# Patient Record
Sex: Male | Born: 1950
Health system: Southern US, Community
[De-identification: ages and names within clinical notes are randomized; demographics above are authoritative.]

## PROBLEM LIST (undated history)

## (undated) DIAGNOSIS — B019 Varicella without complication: Secondary | ICD-10-CM

## (undated) HISTORY — DX: Varicella without complication: B01.9

## (undated) HISTORY — PX: BACK SURGERY: SHX140

---
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
## 2005-10-14 HISTORY — PX: APPENDECTOMY: SHX54

## 2009-01-29 ENCOUNTER — Emergency Department (HOSPITAL_COMMUNITY): Admission: AC | Admit: 2009-01-29 | Discharge: 2009-01-29 | Payer: Self-pay | Admitting: Emergency Medicine

## 2013-12-16 ENCOUNTER — Encounter: Payer: Self-pay | Admitting: Internal Medicine

## 2013-12-16 ENCOUNTER — Ambulatory Visit (INDEPENDENT_AMBULATORY_CARE_PROVIDER_SITE_OTHER)
Admission: RE | Admit: 2013-12-16 | Discharge: 2013-12-16 | Disposition: A | Discharge status: Home / Self Care | Payer: BC Managed Care – PPO | Source: Ambulatory Visit | Attending: Internal Medicine | Admitting: Internal Medicine

## 2013-12-16 ENCOUNTER — Ambulatory Visit (INDEPENDENT_AMBULATORY_CARE_PROVIDER_SITE_OTHER): Payer: BC Managed Care – PPO | Admitting: Internal Medicine

## 2013-12-16 VITALS — BP 122/86 | HR 81 | Temp 98.3°F | Ht 71.5 in | Wt 224.0 lb

## 2013-12-16 DIAGNOSIS — M25519 Pain in unspecified shoulder: Secondary | ICD-10-CM

## 2013-12-16 DIAGNOSIS — M25512 Pain in left shoulder: Secondary | ICD-10-CM

## 2013-12-16 DIAGNOSIS — Z0000 Encounter for general adult medical examination without abnormal findings: Secondary | ICD-10-CM

## 2013-12-16 LAB — COMPREHENSIVE METABOLIC PANEL
ALBUMIN: 4.4 g/dL (ref 3.5–5.2)
ALT: 23 U/L (ref 0–53)
AST: 19 U/L (ref 0–37)
Alkaline Phosphatase: 44 U/L (ref 39–117)
BUN: 20 mg/dL (ref 6–23)
CHLORIDE: 102 meq/L (ref 96–112)
CO2: 28 mEq/L (ref 19–32)
Calcium: 9.9 mg/dL (ref 8.4–10.5)
Creatinine, Ser: 1.3 mg/dL (ref 0.4–1.5)
GFR: 59.15 mL/min — ABNORMAL LOW (ref >60.00)
Glucose, Bld: 77 mg/dL (ref 70–99)
POTASSIUM: 3.9 meq/L (ref 3.5–5.1)
SODIUM: 139 meq/L (ref 135–145)
TOTAL PROTEIN: 7 g/dL (ref 6.0–8.3)
Total Bilirubin: 1 mg/dL (ref 0.3–1.2)

## 2013-12-16 LAB — LIPID PANEL
CHOL/HDL RATIO: 5
Cholesterol: 170 mg/dL (ref 0–200)
HDL: 34.7 mg/dL — ABNORMAL LOW (ref >39.00)
LDL Cholesterol: 105 mg/dL — ABNORMAL HIGH (ref 0–99)
TRIGLYCERIDES: 151 mg/dL — AB (ref 0.0–149.0)
VLDL: 30.2 mg/dL (ref 0.0–40.0)

## 2013-12-16 LAB — CBC
HCT: 44.7 % (ref 39.0–52.0)
Hemoglobin: 15.1 g/dL (ref 13.0–17.0)
MCHC: 33.7 g/dL (ref 30.0–36.0)
MCV: 88.4 fl (ref 78.0–100.0)
Platelets: 567 10*3/uL — ABNORMAL HIGH (ref 150.0–400.0)
RBC: 5.06 Mil/uL (ref 4.22–5.81)
RDW: 14.5 % (ref 11.5–14.6)
WBC: 7.7 10*3/uL (ref 4.5–10.5)

## 2013-12-16 MED ORDER — HYDROCODONE-ACETAMINOPHEN 10-325 MG PO TABS: 1.0000 | ORAL_TABLET | Freq: Two times a day (BID) | ORAL | Status: DC | PRN

## 2013-12-16 NOTE — Progress Notes (Signed)
Pre visit review using our clinic review tool, if applicable. No additional management support is needed unless otherwise documented below in the visit note. 

## 2013-12-16 NOTE — Patient Instructions (Addendum)

## 2013-12-16 NOTE — Assessment & Plan Note (Signed)
Will check xray today Norco refilled If xray negative will need to proceed with MRI to look at ligaments, tendons and rotator cuff Shoulder exercises given
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
## 2014-01-24 ENCOUNTER — Other Ambulatory Visit: Payer: Self-pay | Admitting: Internal Medicine

## 2014-01-24 ENCOUNTER — Telehealth: Payer: Self-pay | Admitting: Internal Medicine

## 2014-01-24 DIAGNOSIS — M25512 Pain in left shoulder: Secondary | ICD-10-CM

## 2014-01-24 NOTE — Telephone Encounter (Signed)
Pt does want to proceed with getting the MRI, in the mean time while waiting on the call to make appt he states he wanted to find out what or if insurance will cover

## 2014-01-24 NOTE — Telephone Encounter (Signed)
Please see msg below--please advise

## 2014-01-24 NOTE — Telephone Encounter (Signed)
Pt is calling back and says his shoulder has gotten way worse from when he was seen a couple of weeks ago. He says he was told to call back if it got worse. Please advise.

## 2014-01-24 NOTE — Telephone Encounter (Signed)
Does he want to proceed with MRI?

## 2014-02-02 ENCOUNTER — Ambulatory Visit
Admission: RE | Admit: 2014-02-02 | Discharge: 2014-02-02 | Disposition: A | Discharge status: Home / Self Care | Payer: BC Managed Care – PPO | Source: Ambulatory Visit | Attending: Internal Medicine | Admitting: Internal Medicine

## 2014-02-02 DIAGNOSIS — M25512 Pain in left shoulder: Secondary | ICD-10-CM

## 2014-02-03 ENCOUNTER — Other Ambulatory Visit: Payer: Self-pay | Admitting: Internal Medicine

## 2014-02-03 DIAGNOSIS — M25519 Pain in unspecified shoulder: Secondary | ICD-10-CM

## 2014-02-07 ENCOUNTER — Other Ambulatory Visit: Payer: Self-pay

## 2014-02-07 DIAGNOSIS — M25512 Pain in left shoulder: Secondary | ICD-10-CM

## 2014-02-07 MED ORDER — HYDROCODONE-ACETAMINOPHEN 10-325 MG PO TABS: 1.0000 | ORAL_TABLET | Freq: Two times a day (BID) | ORAL | Status: DC | PRN

## 2014-02-07 NOTE — Telephone Encounter (Signed)
Rx left in front office for pick up and pt is aware  

## 2014-02-07 NOTE — Telephone Encounter (Signed)
Pt left v/m requesting rx hydrocodone apap. Call when ready for pick up. Pt is out of med. 

## 2014-03-28 ENCOUNTER — Other Ambulatory Visit: Payer: Self-pay

## 2014-03-28 DIAGNOSIS — M25512 Pain in left shoulder: Secondary | ICD-10-CM

## 2014-03-28 MED ORDER — HYDROCODONE-ACETAMINOPHEN 10-325 MG PO TABS: 1.0000 | ORAL_TABLET | Freq: Two times a day (BID) | ORAL | Status: DC | PRN

## 2014-03-28 NOTE — Telephone Encounter (Signed)
Spoke to pt and informed him Rx is available for pickup  

## 2014-03-28 NOTE — Telephone Encounter (Signed)
Pt left v/m requesting rx hydrocodone apap. Call when ready for pick up.  

## 2014-03-29 ENCOUNTER — Encounter: Payer: Self-pay | Admitting: Internal Medicine

## 2014-05-24 ENCOUNTER — Other Ambulatory Visit: Payer: Self-pay

## 2014-05-24 DIAGNOSIS — M25512 Pain in left shoulder: Secondary | ICD-10-CM

## 2014-05-24 NOTE — Telephone Encounter (Signed)
Pt left v/m requesting rx hydrocodone apap. Call when ready for pick up.  

## 2014-05-25 ENCOUNTER — Encounter: Payer: Self-pay | Admitting: Internal Medicine

## 2014-05-25 MED ORDER — HYDROCODONE-ACETAMINOPHEN 10-325 MG PO TABS: 1.0000 | ORAL_TABLET | Freq: Two times a day (BID) | ORAL | Status: DC | PRN

## 2014-05-25 NOTE — Telephone Encounter (Signed)
Rx left in front office for pick up and pt is aware  

## 2014-06-14 ENCOUNTER — Encounter: Payer: Self-pay | Admitting: Internal Medicine

## 2014-07-15 ENCOUNTER — Other Ambulatory Visit: Payer: Self-pay

## 2014-07-15 DIAGNOSIS — M25512 Pain in left shoulder: Secondary | ICD-10-CM

## 2014-07-15 MED ORDER — HYDROCODONE-ACETAMINOPHEN 10-325 MG PO TABS: 1.0000 | ORAL_TABLET | Freq: Two times a day (BID) | ORAL | Status: DC | PRN

## 2014-07-15 NOTE — Telephone Encounter (Signed)
Pt left v/m requesting rx hydrocodone apap. Call when ready for pick up.  

## 2014-07-18 NOTE — Telephone Encounter (Signed)
Rx left in front office for pick up and pt is aware  

## 2014-09-12 ENCOUNTER — Other Ambulatory Visit: Payer: Self-pay

## 2014-09-12 DIAGNOSIS — M25512 Pain in left shoulder: Secondary | ICD-10-CM

## 2014-09-12 NOTE — Telephone Encounter (Signed)
Pt left v/m requesting rx hydrocodone apap. Call when ready for pick up.  

## 2014-09-13 MED ORDER — HYDROCODONE-ACETAMINOPHEN 10-325 MG PO TABS: 1.0000 | ORAL_TABLET | Freq: Two times a day (BID) | ORAL | Status: DC | PRN

## 2014-09-13 NOTE — Telephone Encounter (Signed)
UDS reviewed- not due for UDS, [facility]

## 2014-09-13 NOTE — Telephone Encounter (Signed)
Rx left in front office for pick up and pt is aware  

## 2014-10-25 ENCOUNTER — Other Ambulatory Visit: Payer: Self-pay

## 2014-10-25 DIAGNOSIS — M25512 Pain in left shoulder: Secondary | ICD-10-CM

## 2014-10-25 MED ORDER — HYDROCODONE-ACETAMINOPHEN 10-325 MG PO TABS: 1.0000 | ORAL_TABLET | Freq: Two times a day (BID) | ORAL | Status: DC | PRN

## 2014-10-25 NOTE — Telephone Encounter (Signed)
Pt left v/m requesting rx hydrocodone apap. Call when ready for pick up.last seen 12/16/13 no future appt scheduled.

## 2014-10-25 NOTE — Telephone Encounter (Signed)
RX printed and signed, placed in Mel's box

## 2014-10-26 NOTE — Telephone Encounter (Signed)
Rx left in front office for pick up and pt is aware  

## 2014-11-17 ENCOUNTER — Ambulatory Visit (INDEPENDENT_AMBULATORY_CARE_PROVIDER_SITE_OTHER): Payer: BLUE CROSS/BLUE SHIELD | Admitting: Internal Medicine

## 2014-11-17 ENCOUNTER — Encounter: Payer: Self-pay | Admitting: Internal Medicine

## 2014-11-17 VITALS — BP 118/80 | HR 61 | Temp 98.5°F | Wt 231.0 lb

## 2014-11-17 DIAGNOSIS — M259 Joint disorder, unspecified: Secondary | ICD-10-CM

## 2014-11-17 DIAGNOSIS — L989 Disorder of the skin and subcutaneous tissue, unspecified: Secondary | ICD-10-CM

## 2014-11-17 DIAGNOSIS — M19019 Primary osteoarthritis, unspecified shoulder: Secondary | ICD-10-CM

## 2014-11-17 NOTE — Patient Instructions (Signed)

## 2014-11-17 NOTE — Progress Notes (Signed)
Subjective:    Patient ID: Thomas Lamb, male    DOB: 07-31-1951, 64 y.o.   MRN: [id number]  HPI  Pt presents to the clinic today with c/o left shoulder pain. This has been a chronic issue for him. Xray of left shoulder and MRI reviewed in EMR, showed OA of left AC joint. It seems to be worse anytime he reaches for anything, above his head or behind his back. He denies numbness, tingling or weakness in the left arm. He takes glucosamine and norco for his pain. He has not had any injury to the area. He would like referral to an arthritis specialist he found at [facility].  Additionally, he is concerned about 2 moles that are on his back. They have been there a long time but he reports they are changing. They seem to be scaly and itchy. The areas have not drained. He has not tried anything OTC. He has no history of skin cancer. He would like them evaluated today.  Review of Systems      Past Medical History  Diagnosis Date  . Chicken pox     Current Outpatient Prescriptions  Medication Sig Dispense Refill  . Glucosa-Chondr-Na Chondr-MSM [id number] MG TABS Take 1 tablet by mouth daily.    Marland Kitchen HYDROcodone-acetaminophen (NORCO) 10-325 MG per tablet Take 1 tablet by mouth 2 (two) times daily as needed. 30 tablet 0  . Multiple Vitamin (MULTIVITAMIN) tablet Take 1 tablet by mouth daily.     No current facility-administered medications for this visit.    Allergies  Allergen Reactions  . Penicillins Hives    Family History  Problem Relation Age of Onset  . Arthritis Mother   . Arthritis Father   . Stroke Father   . Hypertension Father   . Cancer Neg Hx   . Diabetes Neg Hx   . Early death Neg Hx     History   Social History  . Marital Status: Married    Spouse Name: N/A    Number of Children: N/A  . Years of Education: N/A   Occupational History  . Not on file.   Social History Main Topics  . Smoking status: Former Smoker    Types: Cigarettes    . Smokeless tobacco: Never Used  . Alcohol Use: 1.2 oz/week    2 Cans of beer per week     Comment: occasional  . Drug Use: No  . Sexual Activity: Not on file   Other Topics Concern  . Not on file   Social History Narrative     Constitutional: Denies fever, malaise, fatigue, headache or abrupt weight changes.  Respiratory: Denies difficulty breathing, shortness of breath, cough or sputum production.   Cardiovascular: Denies chest pain, chest tightness, palpitations or swelling in the hands or feet.  Musculoskeletal: Pt reports left shoulder pain. Denies difficulty with gait, muscle pain or joint swelling.  Skin: Pt reports abnormal mole on back. Denies redness, rashes, or ulcercations.  Neurological: Denies dizziness, difficulty with memory, difficulty with speech or problems with balance and coordination.   No other specific complaints in a complete review of systems (except as listed in HPI above).  Objective:   Physical Exam   BP 118/80 mmHg  Pulse 61  Temp(Src) 98.5 F (36.9 C) (Oral)  Wt 231 lb (104.781 kg)  SpO2 98% Wt Readings from Last 3 Encounters:  11/17/14 231 lb (104.781 kg)  12/16/13 224 lb (101.606 kg)    General: Appears his stated  age, well developed, well nourished in NAD. Skin: Warm, dry and intact. 0.5 cm oval dark, scaly lesion noted on right upper back. Borders regular, uniform in color but very dark. 0.25 cm oval lesion noted an right shoulder blade. Skin colored with dark center, scaly but with regular borders. Cardiovascular: Normal rate and rhythm. S1,S2 noted.  No murmur, rubs or gallops noted.  Pulmonary/Chest: Normal effort and positive vesicular breath sounds. No respiratory distress. No wheezes, rales or ronchi noted.  Musculoskeletal: Decreased internal and external rotation of the left shoulder. Pain with palpation of the left AC joint. Strength 5/5 BUE. Neurological: Alert and oriented. Coordination normal.   BMET    Component Value  Date/Time   NA 139 12/16/2013 1018   K 3.9 12/16/2013 1018   CL 102 12/16/2013 1018   CO2 28 12/16/2013 1018   GLUCOSE 77 12/16/2013 1018   BUN 20 12/16/2013 1018   CREATININE 1.3 12/16/2013 1018   CALCIUM 9.9 12/16/2013 1018    Lipid Panel     Component Value Date/Time   CHOL 170 12/16/2013 1018   TRIG 151.0* 12/16/2013 1018   HDL 34.70* 12/16/2013 1018   CHOLHDL 5 12/16/2013 1018   VLDL 30.2 12/16/2013 1018   LDLCALC 105* 12/16/2013 1018    CBC    Component Value Date/Time   WBC 7.7 12/16/2013 1018   RBC 5.06 12/16/2013 1018   HGB 15.1 12/16/2013 1018   HCT 44.7 12/16/2013 1018   PLT 567.0* 12/16/2013 1018   MCV 88.4 12/16/2013 1018   MCHC 33.7 12/16/2013 1018   RDW 14.5 12/16/2013 1018    Hgb A1C No results found for: HGBA1C      Assessment & Plan:   Skin lesion of back:  Likely an actinic keratosis Will refer to derm for further evaluation and biopsy  AC arthropathy of left shoulder:  Xray and MRI reviewed Continue glucosamine and norco Will refer to [facility]- Dr. Dareen PianoAnderson or Dr. Nickola MajorHawkes for further evaluation  RTC as needed or if symptoms persist or worsen

## 2014-11-17 NOTE — Progress Notes (Signed)
Pre visit review using our clinic review tool, if applicable. No additional management support is needed unless otherwise documented below in the visit note. 

## 2014-11-21 ENCOUNTER — Ambulatory Visit (INDEPENDENT_AMBULATORY_CARE_PROVIDER_SITE_OTHER): Payer: BLUE CROSS/BLUE SHIELD | Admitting: Family Medicine

## 2014-11-21 ENCOUNTER — Encounter: Payer: Self-pay | Admitting: Family Medicine

## 2014-11-21 VITALS — BP 120/74 | HR 60 | Temp 98.2°F | Ht 71.5 in | Wt 232.2 lb

## 2014-11-21 DIAGNOSIS — M19019 Primary osteoarthritis, unspecified shoulder: Secondary | ICD-10-CM

## 2014-11-21 DIAGNOSIS — M67912 Unspecified disorder of synovium and tendon, left shoulder: Secondary | ICD-10-CM

## 2014-11-21 DIAGNOSIS — M259 Joint disorder, unspecified: Secondary | ICD-10-CM

## 2014-11-21 DIAGNOSIS — M7502 Adhesive capsulitis of left shoulder: Secondary | ICD-10-CM

## 2014-11-21 DIAGNOSIS — M25512 Pain in left shoulder: Secondary | ICD-10-CM

## 2014-11-21 MED ORDER — METHYLPREDNISOLONE ACETATE 40 MG/ML IJ SUSP
80.0000 mg | Freq: Once | INTRAMUSCULAR | Status: AC
  Administered 2014-11-21: 80 mg via INTRA_ARTICULAR

## 2014-11-21 NOTE — Progress Notes (Signed)
Pre visit review using our clinic review tool, if applicable. No additional management support is needed unless otherwise documented below in the visit note. 
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
## 2014-11-25 DIAGNOSIS — M25519 Pain in unspecified shoulder: Secondary | ICD-10-CM

## 2014-11-25 DIAGNOSIS — G8929 Other chronic pain: Secondary | ICD-10-CM | POA: Insufficient documentation

## 2014-12-29 ENCOUNTER — Other Ambulatory Visit: Payer: Self-pay

## 2014-12-29 DIAGNOSIS — M25512 Pain in left shoulder: Secondary | ICD-10-CM

## 2014-12-29 MED ORDER — HYDROCODONE-ACETAMINOPHEN 10-325 MG PO TABS: 1.0000 | ORAL_TABLET | Freq: Two times a day (BID) | ORAL | Status: DC | PRN

## 2014-12-29 NOTE — Telephone Encounter (Signed)
RX printed and signed in MYD box

## 2014-12-29 NOTE — Telephone Encounter (Signed)
Pt left v/m requesting rx hydrocodone apap. Call when ready for pick up.  

## 2014-12-30 NOTE — Telephone Encounter (Signed)
Rx left in front office for pick up and pt is aware  

## 2015-01-18 ENCOUNTER — Ambulatory Visit: Payer: BLUE CROSS/BLUE SHIELD | Admitting: Family Medicine

## 2015-02-16 ENCOUNTER — Other Ambulatory Visit: Payer: Self-pay

## 2015-02-16 DIAGNOSIS — M25512 Pain in left shoulder: Secondary | ICD-10-CM

## 2015-02-16 MED ORDER — HYDROCODONE-ACETAMINOPHEN 10-325 MG PO TABS: 1.0000 | ORAL_TABLET | Freq: Two times a day (BID) | ORAL | Status: DC | PRN

## 2015-02-16 NOTE — Telephone Encounter (Signed)
Rx left in front office for pick up and pt is aware  

## 2015-02-16 NOTE — Telephone Encounter (Signed)
RX printed and signed and placed in MYD box 

## 2015-02-16 NOTE — Telephone Encounter (Signed)
Pt left v/m requesting rx hydrocodone apap. Call when ready for pick up. Pt last seen 11/21/2014 and rx last printed 12/29/14 for # 30.

## 2015-04-11 ENCOUNTER — Other Ambulatory Visit: Payer: Self-pay

## 2015-04-11 DIAGNOSIS — M25512 Pain in left shoulder: Secondary | ICD-10-CM

## 2015-04-11 NOTE — Telephone Encounter (Signed)
Pt left v/m requesting rx hydrocodone apap. Call when ready for pick up ASAP.rx last printed # 30 on 02/16/15; last seen 11/21/2014 with shoulder pain.

## 2015-04-12 MED ORDER — HYDROCODONE-ACETAMINOPHEN 10-325 MG PO TABS: 1.0000 | ORAL_TABLET | Freq: Two times a day (BID) | ORAL | Status: DC | PRN

## 2015-04-12 NOTE — Addendum Note (Signed)
Addended by: Roena MaladyEVONTENNO, Jaleeyah Munce Y on: 04/12/2015 11:36 AM   Modules accepted: Orders

## 2015-04-12 NOTE — Telephone Encounter (Signed)
RX printed and signed placed in MYD box

## 2015-04-12 NOTE — Telephone Encounter (Signed)
Rx left in front office for pick up and pt is aware UDS needed with next refill

## 2015-06-15 ENCOUNTER — Encounter: Payer: Self-pay | Admitting: Internal Medicine

## 2015-06-15 ENCOUNTER — Other Ambulatory Visit: Payer: Self-pay

## 2015-06-15 DIAGNOSIS — M25512 Pain in left shoulder: Secondary | ICD-10-CM

## 2015-06-15 MED ORDER — HYDROCODONE-ACETAMINOPHEN 10-325 MG PO TABS: 1.0000 | ORAL_TABLET | Freq: Two times a day (BID) | ORAL | Status: DC | PRN

## 2015-06-15 NOTE — Telephone Encounter (Signed)
Pt left v/m requesting rx hydrocodone apap. Call when ready for pick up. rx last printed # 30 on 04/12/15. Pt last seen 11/21/2014.

## 2015-06-15 NOTE — Telephone Encounter (Signed)
Rx left in front office for pick up and pt is aware Pt is due for repeat UDS

## 2015-06-15 NOTE — Telephone Encounter (Signed)
RX printed and signed and placed in MYD box 

## 2015-07-17 ENCOUNTER — Encounter: Payer: Self-pay | Admitting: Internal Medicine

## 2015-08-09 ENCOUNTER — Encounter: Payer: Self-pay | Admitting: Internal Medicine

## 2015-08-09 ENCOUNTER — Other Ambulatory Visit: Payer: Self-pay | Admitting: *Deleted

## 2015-08-09 DIAGNOSIS — M25512 Pain in left shoulder: Secondary | ICD-10-CM

## 2015-08-09 MED ORDER — HYDROCODONE-ACETAMINOPHEN 10-325 MG PO TABS: 1.0000 | ORAL_TABLET | Freq: Two times a day (BID) | ORAL | Status: DC | PRN

## 2015-08-09 NOTE — Telephone Encounter (Signed)
Error no UDS needed

## 2015-08-09 NOTE — Telephone Encounter (Signed)
RX printed and signed and placed in MYD box 

## 2015-08-09 NOTE — Telephone Encounter (Signed)
Rx left in front office for pick up and pt is aware Needs UDS

## 2015-10-02 ENCOUNTER — Other Ambulatory Visit: Payer: Self-pay

## 2015-10-02 DIAGNOSIS — M25512 Pain in left shoulder: Secondary | ICD-10-CM

## 2015-10-02 MED ORDER — HYDROCODONE-ACETAMINOPHEN 10-325 MG PO TABS: 1.0000 | ORAL_TABLET | Freq: Two times a day (BID) | ORAL | Status: DC | PRN

## 2015-10-02 NOTE — Telephone Encounter (Signed)
Rx left in front office for pick up and pt is aware  

## 2015-10-02 NOTE — Telephone Encounter (Signed)
Pt left v/m requesting rx hydrocodone apap. Call when ready for pick up. rx last printed # 30 on 08/09/15. Last seen 11/21/14.

## 2015-10-02 NOTE — Telephone Encounter (Signed)
RX printed and signed and placed in MYD box 

## 2015-11-29 ENCOUNTER — Other Ambulatory Visit: Payer: Self-pay | Admitting: *Deleted

## 2015-11-29 DIAGNOSIS — M25512 Pain in left shoulder: Secondary | ICD-10-CM

## 2015-11-29 MED ORDER — HYDROCODONE-ACETAMINOPHEN 10-325 MG PO TABS: 1.0000 | ORAL_TABLET | Freq: Two times a day (BID) | ORAL | Status: DC | PRN

## 2015-11-29 NOTE — Telephone Encounter (Signed)
Last filled #30 10/02/15.  Patient has not been seen in our office since February 2016.  Okay to refill?

## 2015-11-29 NOTE — Telephone Encounter (Signed)
Rx left in front office for pick up and pt is aware Pt expressed understanding to schedule annual physical

## 2015-11-29 NOTE — Telephone Encounter (Signed)
Will refill x 30 days, he needs to make follow up appt

## 2016-02-08 ENCOUNTER — Other Ambulatory Visit: Payer: Self-pay

## 2016-02-08 DIAGNOSIS — M25512 Pain in left shoulder: Secondary | ICD-10-CM

## 2016-02-08 MED ORDER — HYDROCODONE-ACETAMINOPHEN 10-325 MG PO TABS: 1.0000 | ORAL_TABLET | Freq: Two times a day (BID) | ORAL | Status: DC | PRN

## 2016-02-08 NOTE — Telephone Encounter (Signed)
RX printed and signed and placed in MYD box. He needs to make an appt for follow up or annual exam

## 2016-02-08 NOTE — Telephone Encounter (Signed)
Pt left v/m requesting rx hydrocodone apap. Call when ready for pick up. rx last printed # 30 on 11/29/15. Pt last seen 11/21/2014. No future appt scheduled.

## 2016-02-08 NOTE — Telephone Encounter (Signed)
Rx left in front office for pick up and pt is aware  

## 2016-04-11 ENCOUNTER — Other Ambulatory Visit: Payer: Self-pay

## 2016-04-11 DIAGNOSIS — M25512 Pain in left shoulder: Secondary | ICD-10-CM

## 2016-04-11 NOTE — Telephone Encounter (Signed)
He must schedule an appt or no refill will be given

## 2016-04-11 NOTE — Telephone Encounter (Signed)
Per 11/2015 and 01/2016 refill note--pt was advised both times that he needed to schedule an annual exam and expressed understanding

## 2016-04-11 NOTE — Telephone Encounter (Signed)
Pt left v/m requesting rx hydrocodone apap. Call when ready for pick up. Last printed # 30 on 02/08/16. Last seen 11/21/2014. No future appt scheduled.

## 2016-04-12 NOTE — Telephone Encounter (Signed)
Pt has CPE appt scheduled for 07/2016---pt is aware he needs to keep appt---pt wants to pick up Rx Monday afternoon

## 2016-04-12 NOTE — Addendum Note (Signed)
Addended by: Roena MaladyEVONTENNO, Patrese Neal Y on: 04/12/2016 04:18 PM   Modules accepted: Orders

## 2016-04-13 NOTE — Telephone Encounter (Signed)
Ok, will have Dr. Dayton MartesAron fill in my abscence

## 2016-04-15 MED ORDER — HYDROCODONE-ACETAMINOPHEN 10-325 MG PO TABS: 1.0000 | ORAL_TABLET | Freq: Two times a day (BID) | ORAL | Status: DC | PRN

## 2016-04-15 NOTE — Telephone Encounter (Signed)
Lm on pts vm and advised Rx is available for pickup from the front desk 

## 2016-05-29 ENCOUNTER — Other Ambulatory Visit: Payer: Self-pay

## 2016-05-29 DIAGNOSIS — M25512 Pain in left shoulder: Secondary | ICD-10-CM

## 2016-05-29 MED ORDER — HYDROCODONE-ACETAMINOPHEN 10-325 MG PO TABS: 1.0000 | ORAL_TABLET | Freq: Two times a day (BID) | ORAL | 0 refills | Status: DC | PRN

## 2016-05-29 NOTE — Telephone Encounter (Signed)
Rx left in front office for pick up and pt is aware  

## 2016-05-29 NOTE — Telephone Encounter (Signed)
Pt left v/m requesting rx hydrocodone apap. Call when ready for pick up. rx last printed # 30 on 04/15/16; last acute visit 11/21/14; pt has CPX scheduled on 07/15/16.Please advise.

## 2016-05-29 NOTE — Telephone Encounter (Signed)
RX printed and signed and placed in MYD box 

## 2016-07-15 ENCOUNTER — Ambulatory Visit (INDEPENDENT_AMBULATORY_CARE_PROVIDER_SITE_OTHER): Payer: Medicare HMO | Admitting: Internal Medicine

## 2016-07-15 ENCOUNTER — Encounter: Payer: Self-pay | Admitting: Internal Medicine

## 2016-07-15 VITALS — BP 124/80 | HR 72 | Temp 98.9°F | Ht 71.75 in | Wt 232.2 lb

## 2016-07-15 DIAGNOSIS — Z0000 Encounter for general adult medical examination without abnormal findings: Secondary | ICD-10-CM | POA: Diagnosis not present

## 2016-07-15 DIAGNOSIS — Z114 Encounter for screening for human immunodeficiency virus [HIV]: Secondary | ICD-10-CM | POA: Diagnosis not present

## 2016-07-15 DIAGNOSIS — Z1159 Encounter for screening for other viral diseases: Secondary | ICD-10-CM

## 2016-07-15 DIAGNOSIS — M7502 Adhesive capsulitis of left shoulder: Secondary | ICD-10-CM

## 2016-07-15 DIAGNOSIS — R69 Illness, unspecified: Secondary | ICD-10-CM | POA: Diagnosis not present

## 2016-07-15 LAB — COMPREHENSIVE METABOLIC PANEL
ALBUMIN: 4 g/dL (ref 3.5–5.2)
ALK PHOS: 48 U/L (ref 39–117)
ALT: 25 U/L (ref 0–53)
AST: 22 U/L (ref 0–37)
BUN: 19 mg/dL (ref 6–23)
CO2: 30 mEq/L (ref 19–32)
Calcium: 9.3 mg/dL (ref 8.4–10.5)
Chloride: 108 mEq/L (ref 96–112)
Creatinine, Ser: 1.08 mg/dL (ref 0.40–1.50)
GFR: 72.67 mL/min (ref >60.00)
Glucose, Bld: 69 mg/dL — ABNORMAL LOW (ref 70–99)
POTASSIUM: 4.5 meq/L (ref 3.5–5.1)
Sodium: 143 mEq/L (ref 135–145)
Total Bilirubin: 0.4 mg/dL (ref 0.2–1.2)
Total Protein: 6.6 g/dL (ref 6.0–8.3)

## 2016-07-15 LAB — CBC
HEMATOCRIT: 42.2 % (ref 39.0–52.0)
HEMOGLOBIN: 14.4 g/dL (ref 13.0–17.0)
MCHC: 34.1 g/dL (ref 30.0–36.0)
MCV: 86.5 fl (ref 78.0–100.0)
Platelets: 506 10*3/uL — ABNORMAL HIGH (ref 150.0–400.0)
RBC: 4.88 Mil/uL (ref 4.22–5.81)
RDW: 14.4 % (ref 11.5–15.5)
WBC: 6.6 10*3/uL (ref 4.0–10.5)

## 2016-07-15 LAB — LIPID PANEL
CHOLESTEROL: 152 mg/dL (ref 0–200)
HDL: 36.5 mg/dL — AB (ref >39.00)
LDL Cholesterol: 83 mg/dL (ref 0–99)
NonHDL: 115.73
Total CHOL/HDL Ratio: 4
Triglycerides: 163 mg/dL — ABNORMAL HIGH (ref 0.0–149.0)
VLDL: 32.6 mg/dL (ref 0.0–40.0)

## 2016-07-15 MED ORDER — HYDROCODONE-ACETAMINOPHEN 10-325 MG PO TABS: 1.0000 | ORAL_TABLET | Freq: Two times a day (BID) | ORAL | 0 refills | Status: DC | PRN

## 2016-07-15 NOTE — Assessment & Plan Note (Signed)
Improved Continue Glucosamin-Chondroitin Norco refilled today Sent to lab to update CSA/UDS

## 2016-07-15 NOTE — Patient Instructions (Signed)

## 2016-07-15 NOTE — Progress Notes (Signed)
Subjective:    Patient ID: Thomas Lamb, male    DOB: 11/21/1950, 65 y.o.   MRN: [id number]  HPI  Pt presents to the clinic today for his Medicare Wellness Exam. He is also due to follow up left shoulder pain.  Adhesive capsulitis of left shoulder: Improved. Secondary to rotator cuff tendinopathy and AC joint arthritis. S/p steroid injections. He takes Glucosamine-Chondroitin daily and Norco as needed with good relief. He would like a refill of the Norco today. He is due to update is CSA/UDS today. CSRS reviewed- no other prescribers.   Past Medical History:  Diagnosis Date  . Chicken pox     Current Outpatient Prescriptions  Medication Sig Dispense Refill  . Glucosa-Chondr-Na Chondr-MSM [phone] MG TABS Take 1 tablet by mouth daily.    Marland Kitchen HYDROcodone-acetaminophen (NORCO) 10-325 MG tablet Take 1 tablet by mouth 2 (two) times daily as needed. 30 tablet 0  . Multiple Vitamin (MULTIVITAMIN) tablet Take 1 tablet by mouth daily.     No current facility-administered medications for this visit.     Allergies  Allergen Reactions  . Penicillins Hives    Family History  Problem Relation Age of Onset  . Arthritis Mother   . Arthritis Father   . Stroke Father   . Hypertension Father   . Cancer Neg Hx   . Diabetes Neg Hx   . Early death Neg Hx     Social History   Social History  . Marital status: Married    Spouse name: N/A  . Number of children: N/A  . Years of education: N/A   Occupational History  . Not on file.   Social History Main Topics  . Smoking status: Former Smoker    Types: Cigarettes  . Smokeless tobacco: Never Used  . Alcohol use 1.2 oz/week    2 Cans of beer per week     Comment: occasional  . Drug use: No  . Sexual activity: Not on file   Other Topics Concern  . Not on file   Social History Narrative  . No narrative on file    Hospitiliaztions: None  Health Maintenance:    Flu: never  Tetanus: within the last 10  years  Pneumovax: never  Prevnar: never  Zostovax: never  PSA screening: never  Colon Screening: never  Vision Screening: yearly  Dentist: yearly  Providers:   PCP: Nicki Reaper, NP-C    I have personally reviewed and have noted:  1. The patient's medical and social history 2. Their use of alcohol, tobacco or illicit drugs 3. Their current medications and supplements 4. The patient's functional ability including ADL's, fall risks, home safety risks and hearing or visual impairment. 5. Diet and physical activities 6. Evidence for depression or mood disorder  Subjective:   Review of Systems:   Constitutional: Denies fever, malaise, fatigue, headache or abrupt weight changes.  HEENT: Pt reports ringing in his ears. Denies eye pain, eye redness, ear pain, wax buildup, runny nose, nasal congestion, bloody nose, or sore throat. Respiratory: Denies difficulty breathing, shortness of breath, cough or sputum production.   Cardiovascular: Denies chest pain, chest tightness, palpitations or swelling in the hands or feet.  Gastrointestinal: Pt reports occasional reflux. Denies abdominal pain, bloating, constipation, diarrhea or blood in the stool.  GU: Denies urgency, frequency, pain with urination, burning sensation, blood in urine, odor or discharge. Musculoskeletal: Pt reports intermittent left shoulder pain. Denies decrease in range of motion, difficulty with gait, muscle pain or  joint swelling.  Skin: Denies redness, rashes, lesions or ulcercations.  Neurological: Denies dizziness, difficulty with memory, difficulty with speech or problems with balance and coordination.  Psych: Denies anxiety, depression, SI/HI.  No other specific complaints in a complete review of systems (except as listed in HPI above).  Objective:  PE:   BP 124/80   Pulse 72   Temp 98.9 F (37.2 C) (Oral)   Ht 5' 11.75" (1.822 m)   Wt 232 lb 4 oz (105.3 kg)   SpO2 98%   BMI 31.72 kg/m  Wt Readings  from Last 3 Encounters:  07/15/16 232 lb 4 oz (105.3 kg)  11/21/14 232 lb 4 oz (105.3 kg)  11/17/14 231 lb (104.8 kg)    General: Appears his stated age, obese in NAD. Skin: Warm, dry and intact.  HEENT: Head: normal shape and size; Eyes: sclera white, no icterus, conjunctiva pink, PERRLA and EOMs intact; Ears: Tm's gray and intact, normal light reflex; Throat/Mouth: Teeth present, mucosa pink and moist, no exudate, lesions or ulcerations noted.  Neck: Neck supple, trachea midline. No masses, lumps or thyromegaly present.  Cardiovascular: Normal rate and rhythm. S1,S2 noted.  No murmur, rubs or gallops noted. No JVD or BLE edema. No carotid bruits noted. Pulmonary/Chest: Normal effort and positive vesicular breath sounds. No respiratory distress. No wheezes, rales or ronchi noted.  Abdomen: Soft and nontender. Normal bowel sounds. No distention or masses noted. Liver, spleen and kidneys non palpable. Musculoskeletal: Normal range of motion. Strength 5/5 BUE/BLE. No signs of joint swelling.  Neurological: Alert and oriented. Cranial nerves II-XII grossly intact. Coordination normal.  Psychiatric: Mood and affect normal. Behavior is normal. Judgment and thought content normal.     BMET    Component Value Date/Time   NA 139 12/16/2013 1018   K 3.9 12/16/2013 1018   CL 102 12/16/2013 1018   CO2 28 12/16/2013 1018   GLUCOSE 77 12/16/2013 1018   BUN 20 12/16/2013 1018   CREATININE 1.3 12/16/2013 1018   CALCIUM 9.9 12/16/2013 1018    Lipid Panel     Component Value Date/Time   CHOL 170 12/16/2013 1018   TRIG 151.0 (H) 12/16/2013 1018   HDL 34.70 (L) 12/16/2013 1018   CHOLHDL 5 12/16/2013 1018   VLDL 30.2 12/16/2013 1018   LDLCALC 105 (H) 12/16/2013 1018    CBC    Component Value Date/Time   WBC 7.7 12/16/2013 1018   RBC 5.06 12/16/2013 1018   HGB 15.1 12/16/2013 1018   HCT 44.7 12/16/2013 1018   PLT 567.0 (H) 12/16/2013 1018   MCV 88.4 12/16/2013 1018   MCHC 33.7  12/16/2013 1018   RDW 14.5 12/16/2013 1018    Hgb A1C No results found for: HGBA1C    Assessment and Plan:   Medicare Annual Wellness Visit:  Diet: He does eat meat. He consume fruits and veggies daily. He does eat fried foods. He drinks mostly Coke Zero. Physical activity: He plays basketball once every other week and walks daily. Depression/mood screen: Negative Hearing: He reports he has difficulty hearing in large crowds. Intact to whispered voice Visual acuity: Grossly normal, performs annual eye exam  ADLs: Capable Fall risk: None Home safety: Good Cognitive evaluation: Intact to orientation, naming, recall and repetition EOL planning: Adv directives, full code/ I agree  Preventative Medicine: He declines flu, tetanus, pneumovax, prevnar and zostovax. He declines prostate or colon cancer screening. Encouraged him to consume a balanced diet and exercise regimen. Advised him to continue to  see an eye doctor and dentist annually. Will check CBC, CMET, Lipid, HIV and Hep C today.   Next appointment: 1 year, Medicare Wellness Exam   Aanyah Loa, NP             Review of Systems   Objective:   Physical Exam   Assessment & Plan:

## 2016-07-16 LAB — HEPATITIS C ANTIBODY: HCV Ab: NEGATIVE

## 2016-07-16 LAB — HIV ANTIBODY (ROUTINE TESTING W REFLEX): HIV 1&2 Ab, 4th Generation: NONREACTIVE

## 2016-09-11 ENCOUNTER — Other Ambulatory Visit: Payer: Self-pay

## 2016-09-11 DIAGNOSIS — M7502 Adhesive capsulitis of left shoulder: Secondary | ICD-10-CM

## 2016-09-11 NOTE — Telephone Encounter (Signed)
Pt left v/m requesting rx hydrocodone apap. Call when ready for pick up.Pt last seen and rx last printed # 30 on 07/15/16. Pt is also changing pharmacies.

## 2016-09-12 MED ORDER — HYDROCODONE-ACETAMINOPHEN 10-325 MG PO TABS: 1.0000 | ORAL_TABLET | Freq: Two times a day (BID) | ORAL | 0 refills | Status: DC | PRN

## 2016-09-12 NOTE — Telephone Encounter (Signed)
RX printed and signed and placed in MYD box 

## 2016-09-12 NOTE — Telephone Encounter (Signed)
Call pt:  I am going to refill his Hydrocodone. As of Jan 1st, I will no longer be prescribing narcotics due to the regulations of the [place] STOP Act. Would he be interested in referral to pain management?  I would fill his Norco until he is seen by them.

## 2016-11-13 ENCOUNTER — Telehealth: Payer: Self-pay | Admitting: Internal Medicine

## 2016-11-13 DIAGNOSIS — M7502 Adhesive capsulitis of left shoulder: Secondary | ICD-10-CM

## 2016-11-13 MED ORDER — HYDROCODONE-ACETAMINOPHEN 10-325 MG PO TABS: 1.0000 | ORAL_TABLET | Freq: Two times a day (BID) | ORAL | 0 refills | Status: DC | PRN

## 2016-11-13 NOTE — Telephone Encounter (Signed)
Rx left in front office for pick up and pt is aware And pt states that he would like to [facility] and she is aware.Thomas Lamb..Thomas Lamb

## 2016-11-13 NOTE — Telephone Encounter (Signed)
Call pt,  I received his letter about transferring care to a physician and needing a refill of his Hydrocodone. I was a little too hasty is deciding about filling narcotics after news about the [place] Stop Act. I should not have sent him this letter, my apologies. I will go ahead and will continue to refill his hydrocodone. If he still wants to transfer care, I am fine with that, please let the front desk know. RX for hydrocodone printed and signed and placed in your box.

## 2016-12-20 ENCOUNTER — Ambulatory Visit: Payer: Self-pay | Admitting: Family Medicine

## 2016-12-31 ENCOUNTER — Ambulatory Visit (INDEPENDENT_AMBULATORY_CARE_PROVIDER_SITE_OTHER): Payer: Medicare HMO | Admitting: Family Medicine

## 2016-12-31 ENCOUNTER — Encounter (INDEPENDENT_AMBULATORY_CARE_PROVIDER_SITE_OTHER): Payer: Self-pay

## 2016-12-31 ENCOUNTER — Encounter: Payer: Self-pay | Admitting: Family Medicine

## 2016-12-31 DIAGNOSIS — R05 Cough: Secondary | ICD-10-CM

## 2016-12-31 DIAGNOSIS — R059 Cough, unspecified: Secondary | ICD-10-CM

## 2016-12-31 MED ORDER — DOXYCYCLINE HYCLATE 100 MG PO TABS: 100.0000 mg | ORAL_TABLET | Freq: Two times a day (BID) | ORAL | 0 refills | Status: DC

## 2016-12-31 MED ORDER — BENZONATATE 200 MG PO CAPS: 200.0000 mg | ORAL_CAPSULE | Freq: Three times a day (TID) | ORAL | 0 refills | Status: DC | PRN

## 2016-12-31 NOTE — Patient Instructions (Signed)
Start doxy, use tessalon as needed for cough.  Update us as needed.  Take care.  Glad to see you.

## 2016-12-31 NOTE — Progress Notes (Signed)
Sx started about 3 weeks ago.  First noted cold sx and ST.  Now with cough.  Congestion continues.  No fevers.  Cough is worse at the day goes on and worst at night.  Some green sputum coughed up.  Sleep disrupted.  No facial pain.  No ear pain.  No vomiting, no diarrhea.    Meds, vitals, and allergies reviewed.   ROS: Per HPI unless specifically indicated in ROS section   GEN: nad, alert and oriented HEENT: mucous membranes moist, tm w/o erythema, nasal exam w/o erythema, clear discharge noted,  OP with cobblestoning NECK: supple w/o LA CV: rrr.   PULM: ctab except for R lower lobe rhonchi clear with a cough.  No wheeze.  No focal dec in BS, no inc wob EXT: no edema

## 2016-12-31 NOTE — Progress Notes (Signed)
Pre visit review using our clinic review tool, if applicable. No additional management support is needed unless otherwise documented below in the visit note. 

## 2017-01-01 DIAGNOSIS — R05 Cough: Secondary | ICD-10-CM | POA: Insufficient documentation

## 2017-01-01 DIAGNOSIS — R059 Cough, unspecified: Secondary | ICD-10-CM | POA: Insufficient documentation

## 2017-01-01 NOTE — Assessment & Plan Note (Signed)
Would treat given duration of symptoms and discolored sputum. Start doxy, use tessalon as needed for cough.  Update us as needed.  Okay for outpatient follow-up. He agrees.

## 2017-01-07 ENCOUNTER — Other Ambulatory Visit: Payer: Self-pay

## 2017-01-07 DIAGNOSIS — M7502 Adhesive capsulitis of left shoulder: Secondary | ICD-10-CM

## 2017-01-07 MED ORDER — HYDROCODONE-ACETAMINOPHEN 10-325 MG PO TABS
1.0000 | ORAL_TABLET | Freq: Two times a day (BID) | ORAL | 0 refills | Status: DC | PRN
Start: 2017-01-07 — End: 2017-02-24

## 2017-01-07 NOTE — Telephone Encounter (Signed)
Spoke to pt and informed him remaining Rx are available for pickup from the front desk. Pt advised third party unable to pickup

## 2017-01-07 NOTE — Telephone Encounter (Signed)
Rx called in to requested pharmacy 

## 2017-01-07 NOTE — Telephone Encounter (Signed)
RX printed and signed and placed in MYD box 

## 2017-01-07 NOTE — Telephone Encounter (Signed)
Pt left v/m requesting rx hydrocodone apap. Call when ready for pick up. Last printed # 30 on 11/13/16; last annual 07/15/16.

## 2017-01-13 ENCOUNTER — Encounter: Payer: Self-pay | Admitting: Internal Medicine

## 2017-01-15 DIAGNOSIS — Z79891 Long term (current) use of opiate analgesic: Secondary | ICD-10-CM | POA: Diagnosis not present

## 2017-02-24 ENCOUNTER — Other Ambulatory Visit: Payer: Self-pay

## 2017-02-24 DIAGNOSIS — M7502 Adhesive capsulitis of left shoulder: Secondary | ICD-10-CM

## 2017-02-24 NOTE — Telephone Encounter (Signed)
Pt left v/m requesting rx hydrocodone apap. Call when ready for pick up. Last printed # 30 on 01/07/17. Last annual 07/15/16.

## 2017-02-25 MED ORDER — HYDROCODONE-ACETAMINOPHEN 10-325 MG PO TABS: 1.0000 | ORAL_TABLET | Freq: Two times a day (BID) | ORAL | 0 refills | Status: DC | PRN

## 2017-02-25 NOTE — Telephone Encounter (Signed)
RX printed and signed and placed in MYD box 

## 2017-02-25 NOTE — Telephone Encounter (Signed)
Rx left in front office for pick up and pt is aware  

## 2017-03-04 DIAGNOSIS — H52223 Regular astigmatism, bilateral: Secondary | ICD-10-CM | POA: Diagnosis not present

## 2017-03-04 DIAGNOSIS — H2513 Age-related nuclear cataract, bilateral: Secondary | ICD-10-CM | POA: Diagnosis not present

## 2017-03-04 DIAGNOSIS — H524 Presbyopia: Secondary | ICD-10-CM | POA: Diagnosis not present

## 2017-03-04 DIAGNOSIS — H5202 Hypermetropia, left eye: Secondary | ICD-10-CM | POA: Diagnosis not present

## 2017-03-04 DIAGNOSIS — H5211 Myopia, right eye: Secondary | ICD-10-CM | POA: Diagnosis not present

## 2017-05-06 ENCOUNTER — Other Ambulatory Visit: Payer: Self-pay

## 2017-05-06 DIAGNOSIS — M7502 Adhesive capsulitis of left shoulder: Secondary | ICD-10-CM

## 2017-05-06 MED ORDER — HYDROCODONE-ACETAMINOPHEN 10-325 MG PO TABS: 1.0000 | ORAL_TABLET | Freq: Two times a day (BID) | ORAL | 0 refills | Status: DC | PRN

## 2017-05-06 NOTE — Telephone Encounter (Signed)
RX printed and signed and placed in MYD box 

## 2017-05-06 NOTE — Telephone Encounter (Signed)
Pt left v/m requesting rx hydrocodone apap. Call when ready for pick up. rx last printed # 30 on 02/25/17. Last seen annual 07/15/16 and last acute visit 12/31/16.Please advise.

## 2017-05-07 NOTE — Telephone Encounter (Signed)
Rx left in front office for pick up and pt is aware  

## 2017-07-15 ENCOUNTER — Other Ambulatory Visit: Payer: Self-pay

## 2017-07-15 DIAGNOSIS — M7502 Adhesive capsulitis of left shoulder: Secondary | ICD-10-CM

## 2017-07-15 NOTE — Telephone Encounter (Signed)
Pt left v/m requesting rx hydrocodone apap. Call when ready for pick up. rx last printed # 30 on 05/06/17. Last annual 07/15/16; last acute 12/31/16 and pt scheduled for CPX on 08/28/17.

## 2017-07-16 MED ORDER — HYDROCODONE-ACETAMINOPHEN 10-325 MG PO TABS: 1.0000 | ORAL_TABLET | Freq: Two times a day (BID) | ORAL | 0 refills | Status: DC | PRN

## 2017-07-16 NOTE — Telephone Encounter (Signed)
RX printed and signed and placed in MYD box 

## 2017-07-17 ENCOUNTER — Encounter: Payer: Medicare HMO | Admitting: Internal Medicine

## 2017-07-17 NOTE — Telephone Encounter (Signed)
Rx left in front office for pick up and pt is aware  

## 2017-08-28 ENCOUNTER — Other Ambulatory Visit (INDEPENDENT_AMBULATORY_CARE_PROVIDER_SITE_OTHER): Payer: Medicare HMO

## 2017-08-28 ENCOUNTER — Encounter: Payer: Self-pay | Admitting: Internal Medicine

## 2017-08-28 ENCOUNTER — Ambulatory Visit (INDEPENDENT_AMBULATORY_CARE_PROVIDER_SITE_OTHER): Payer: Medicare HMO | Admitting: Internal Medicine

## 2017-08-28 VITALS — BP 122/80 | HR 61 | Temp 97.9°F | Ht 71.5 in | Wt 236.0 lb

## 2017-08-28 DIAGNOSIS — G8929 Other chronic pain: Secondary | ICD-10-CM

## 2017-08-28 DIAGNOSIS — M545 Low back pain, unspecified: Secondary | ICD-10-CM

## 2017-08-28 DIAGNOSIS — M549 Dorsalgia, unspecified: Secondary | ICD-10-CM

## 2017-08-28 DIAGNOSIS — R7309 Other abnormal glucose: Secondary | ICD-10-CM

## 2017-08-28 DIAGNOSIS — Z0000 Encounter for general adult medical examination without abnormal findings: Secondary | ICD-10-CM | POA: Diagnosis not present

## 2017-08-28 DIAGNOSIS — M7502 Adhesive capsulitis of left shoulder: Secondary | ICD-10-CM | POA: Diagnosis not present

## 2017-08-28 DIAGNOSIS — Z13220 Encounter for screening for lipoid disorders: Secondary | ICD-10-CM | POA: Diagnosis not present

## 2017-08-28 LAB — COMPREHENSIVE METABOLIC PANEL
ALT: 28 U/L (ref 0–53)
AST: 25 U/L (ref 0–37)
Albumin: 4.5 g/dL (ref 3.5–5.2)
Alkaline Phosphatase: 50 U/L (ref 39–117)
BUN: 26 mg/dL — AB (ref 6–23)
CHLORIDE: 103 meq/L (ref 96–112)
CO2: 32 mEq/L (ref 19–32)
Calcium: 10 mg/dL (ref 8.4–10.5)
Creatinine, Ser: 1.21 mg/dL (ref 0.40–1.50)
GFR: 63.52 mL/min (ref >60.00)
GLUCOSE: 111 mg/dL — AB (ref 70–99)
POTASSIUM: 5 meq/L (ref 3.5–5.1)
Sodium: 139 mEq/L (ref 135–145)
Total Bilirubin: 0.7 mg/dL (ref 0.2–1.2)
Total Protein: 7.1 g/dL (ref 6.0–8.3)

## 2017-08-28 LAB — CBC
HCT: 44.7 % (ref 39.0–52.0)
HEMOGLOBIN: 15 g/dL (ref 13.0–17.0)
MCHC: 33.7 g/dL (ref 30.0–36.0)
MCV: 87.9 fl (ref 78.0–100.0)
Platelets: 471 10*3/uL — ABNORMAL HIGH (ref 150.0–400.0)
RBC: 5.08 Mil/uL (ref 4.22–5.81)
RDW: 14.3 % (ref 11.5–15.5)
WBC: 6.7 10*3/uL (ref 4.0–10.5)

## 2017-08-28 LAB — LIPID PANEL
CHOLESTEROL: 162 mg/dL (ref 0–200)
HDL: 40 mg/dL (ref >39.00)
LDL Cholesterol: 102 mg/dL — ABNORMAL HIGH (ref 0–99)
NONHDL: 122.09
Total CHOL/HDL Ratio: 4
Triglycerides: 100 mg/dL (ref 0.0–149.0)
VLDL: 20 mg/dL (ref 0.0–40.0)

## 2017-08-28 LAB — HEMOGLOBIN A1C: HEMOGLOBIN A1C: 5.1 % (ref 4.6–6.5)

## 2017-08-28 MED ORDER — HYDROCODONE-ACETAMINOPHEN 10-325 MG PO TABS
1.0000 | ORAL_TABLET | Freq: Two times a day (BID) | ORAL | 0 refills | Status: DC | PRN
Start: 2017-08-28 — End: 2017-08-28

## 2017-08-28 MED ORDER — HYDROCODONE-ACETAMINOPHEN 10-325 MG PO TABS: 1.0000 | ORAL_TABLET | Freq: Two times a day (BID) | ORAL | 0 refills | Status: DC | PRN

## 2017-08-28 NOTE — Progress Notes (Signed)
HPI:  Pt presents to the clinic today for his Medicare Wellness Exam.  Chronic Back Pain/Left Shoulder Pain: s/p surgery x 2. He takes Glucosamine-Chondroitin daily. He takes 1/2 tab of Hydrocodone daily. He occasionally takes an additional 1/2 tab in the evening if his back is really bothering him. 30 tablets usually last him about 6 weeks.   Past Medical History:  Diagnosis Date  . Chicken pox     Current Outpatient Medications  Medication Sig Dispense Refill  . Glucosa-Chondr-Na Chondr-MSM [phone] MG TABS Take 1 tablet by mouth daily.    Marland Kitchen. HYDROcodone-acetaminophen (NORCO) 10-325 MG tablet Take 1 tablet by mouth 2 (two) times daily as needed. 30 tablet 0  . Multiple Vitamin (MULTIVITAMIN) tablet Take 1 tablet by mouth daily.     No current facility-administered medications for this visit.     Allergies  Allergen Reactions  . Penicillins Hives    Family History  Problem Relation Age of Onset  . Arthritis Mother   . Arthritis Father   . Stroke Father   . Hypertension Father   . Cancer Neg Hx   . Diabetes Neg Hx   . Early death Neg Hx     Social History   Socioeconomic History  . Marital status: Married    Spouse name: Not on file  . Number of children: Not on file  . Years of education: Not on file  . Highest education level: Not on file  Social Needs  . Financial resource strain: Not on file  . Food insecurity - worry: Not on file  . Food insecurity - inability: Not on file  . Transportation needs - medical: Not on file  . Transportation needs - non-medical: Not on file  Occupational History  . Not on file  Tobacco Use  . Smoking status: Former Smoker    Types: Cigarettes    Last attempt to quit: 10/14/2002    Years since quitting: 14.8  . Smokeless tobacco: Never Used  Substance and Sexual Activity  . Alcohol use: Yes    Alcohol/week: 1.2 oz    Types: 2 Cans of beer per week    Comment: occasional  . Drug use: No  . Sexual activity: Not on file   Other Topics Concern  . Not on file  Social History Narrative  . Not on file    Hospitiliaztions: None  Health Maintenance:    Flu: never  Tetanus: < 10 years ago  Pneumovax: never  Prevnar: never  Zostavax: never:  PSA: never  Colon Screening: never  Eye Doctor: annually  Dental Exam: annually   Providers:   PCP: Nicki Reaperegina Makaelyn Aponte, NP-C   I have personally reviewed and have noted:  1. The patient's medical and social history 2. Their use of alcohol, tobacco or illicit drugs 3. Their current medications and supplements 4. The patient's functional ability including ADL's, fall risks, home safety risks and hearing or visual impairment. 5. Diet and physical activities 6. Evidence for depression or mood disorder  Subjective:   Review of Systems:   Constitutional: Denies fever, malaise, fatigue, headache or abrupt weight changes.  HEENT: Denies eye pain, eye redness, ear pain, ringing in the ears, wax buildup, runny nose, nasal congestion, bloody nose, or sore throat. Respiratory: Denies difficulty breathing, shortness of breath, cough or sputum production.   Cardiovascular: Denies chest pain, chest tightness, palpitations or swelling in the hands or feet.  Gastrointestinal: Denies abdominal pain, bloating, constipation, diarrhea or blood in the stool.  GU:  Denies urgency, frequency, pain with urination, burning sensation, blood in urine, odor or discharge. Musculoskeletal: Pt reports chronic back pain. Denies decrease in range of motion, difficulty with gait, muscle pain or joint swelling.  Skin: Denies redness, rashes, lesions or ulcercations.  Neurological: Denies dizziness, difficulty with memory, difficulty with speech or problems with balance and coordination.  Psych: Denies anxiety, depression, SI/HI.  No other specific complaints in a complete review of systems (except as listed in HPI above).  Objective:  PE:   BP 122/80   Pulse 61   Temp 97.9 F (36.6 C)  (Oral)   Ht 5' 11.5" (1.816 m)   Wt 236 lb (107 kg)   SpO2 98%   BMI 32.46 kg/m   Wt Readings from Last 3 Encounters:  12/31/16 238 lb 8 oz (108.2 kg)  07/15/16 232 lb 4 oz (105.3 kg)  11/21/14 232 lb 4 oz (105.3 kg)    General: Appears his stated age, obese in NAD. Skin: Warm, dry and intact.  HEENT: Head: normal shape and size; Eyes: sclera white, no icterus, conjunctiva pink, PERRLA and EOMs intact; Ears: Tm's gray and intact, normal light reflex; Throat/Mouth: Teeth present, mucosa pink and moist, no exudate, lesions or ulcerations noted.  Neck: Neck supple, trachea midline. No masses, lumps or thyromegaly present.  Cardiovascular: Normal rate and rhythm. S1,S2 noted.  No murmur, rubs or gallops noted. No JVD or BLE edema. No carotid bruits noted. Pulmonary/Chest: Normal effort and positive vesicular breath sounds. No respiratory distress. No wheezes, rales or ronchi noted.  Abdomen: Soft and nontender. Normal bowel sounds. No distention or masses noted. Liver, spleen and kidneys non palpable. Musculoskeletal: Normal flexion, extension and rotation of the spine. Bony tenderness noted over the lumbar spine. Strength 5/5 BUE/BLE. No difficulty with gait. Neurological: Alert and oriented. Cranial nerves II-XII grossly intact. Coordination normal.  Psychiatric: Mood and affect normal. Behavior is normal. Judgment and thought content normal.    BMET    Component Value Date/Time   NA 143 07/15/2016 0925   K 4.5 07/15/2016 0925   CL 108 07/15/2016 0925   CO2 30 07/15/2016 0925   GLUCOSE 69 (L) 07/15/2016 0925   BUN 19 07/15/2016 0925   CREATININE 1.08 07/15/2016 0925   CALCIUM 9.3 07/15/2016 0925    Lipid Panel     Component Value Date/Time   CHOL 152 07/15/2016 0925   TRIG 163.0 (H) 07/15/2016 0925   HDL 36.50 (L) 07/15/2016 0925   CHOLHDL 4 07/15/2016 0925   VLDL 32.6 07/15/2016 0925   LDLCALC 83 07/15/2016 0925    CBC    Component Value Date/Time   WBC 6.6  07/15/2016 0925   RBC 4.88 07/15/2016 0925   HGB 14.4 07/15/2016 0925   HCT 42.2 07/15/2016 0925   PLT 506.0 (H) 07/15/2016 0925   MCV 86.5 07/15/2016 0925   MCHC 34.1 07/15/2016 0925   RDW 14.4 07/15/2016 0925    Hgb A1C No results found for: HGBA1C    Assessment and Plan:   Medicare Annual Wellness Visit:  Diet: He does eat meat. He consumes fruits and veggies daily. He does occasionally eat fried foods. He drinks mostly coffee, soda, some water. Physical activity: Sedentary Depression/mood screen: Negative Hearing: Intact to whispered voice Visual acuity: Grossly normal, performs annual eye exam  ADLs: Capable Fall risk: None Home safety: Good Cognitive evaluation: Intact to orientation, naming, recall and repetition EOL planning: No adv directives, full code/ I agree  Preventative Medicine: He declines flu,  tetanus, pneumovax, prevnar or shingles vaccine. He declines PSA or colon cancer screening. Encouraged him to consume a balanced diet and exercise regimen. Advised him to see an eye doctor and dentist annually. Will check CBC, CMET and Lipid profile today.   Next appointment: 3 months, pain management appt Nicki Reaper, NP    Nicki Reaper, NP

## 2017-08-28 NOTE — Patient Instructions (Signed)

## 2017-08-28 NOTE — Progress Notes (Signed)
   Subjective:    Patient ID: Gracelyn NurseWilliam Van Zile, male    DOB: [birth year], 66 y.o.   MRN: [id number]  HPI Reviewed pain needs and agree with current narcotic prescription   Review of Systems     Objective:   Physical Exam        Assessment & Plan:

## 2017-08-28 NOTE — Assessment & Plan Note (Signed)
[place], no other prescribers CSA and UDS UTD Norco prescriptions provided today x 3 months

## 2017-08-28 NOTE — Assessment & Plan Note (Signed)
[place]CC[place] reviewed, no other prescribers CSA and UDS UTD Norco prescriptions provided today x 3 months 

## 2017-11-17 ENCOUNTER — Ambulatory Visit: Payer: Medicare HMO | Admitting: Internal Medicine

## 2018-02-07 ENCOUNTER — Encounter: Payer: Self-pay | Admitting: Family Medicine

## 2018-02-07 ENCOUNTER — Ambulatory Visit (INDEPENDENT_AMBULATORY_CARE_PROVIDER_SITE_OTHER): Payer: Medicare HMO | Admitting: Family Medicine

## 2018-02-07 VITALS — BP 140/76 | HR 53 | Temp 98.0°F | Ht 71.5 in | Wt 242.0 lb

## 2018-02-07 DIAGNOSIS — N476 Balanoposthitis: Secondary | ICD-10-CM

## 2018-02-07 DIAGNOSIS — N5089 Other specified disorders of the male genital organs: Secondary | ICD-10-CM

## 2018-02-07 MED ORDER — MUPIROCIN 2 % EX OINT: 1.0000 "application " | TOPICAL_OINTMENT | Freq: Two times a day (BID) | CUTANEOUS | 0 refills | Status: DC

## 2018-02-07 NOTE — Progress Notes (Signed)
BP 140/76 (BP Location: Right Arm, Patient Position: Sitting, Cuff Size: Large)   Pulse (!) 53   Temp 98 F (36.7 C) (Oral)   Ht 5' 11.5" (1.816 m)   Wt 242 lb (109.8 kg)   SpO2 98%   BMI 33.28 kg/m    CC: rash Subjective:    Patient ID: Thomas Lamb, male    DOB: [birth year], 67 y.o.   MRN: [id number]  HPI: Thomas Lamb is a 67 y.o. male presenting on 02/07/2018 for Rash (off and on for a few weeks)   Erythematous penile rash over the last few months associated with right testicular swelling over last several months. Rash comes and goes, associated with burning no itching. Unchanged urinary urgency over last few years.   Denies fevers/chills, urethral discharge, dysuria, abd pain, nausea/vomiting, bowel changes. No nocturia.  No new lotions detergents soaps or shampoos.  No new medicines, supplements. No new foods.  No oral lesions.   No new sexual partners. Married and in monogamous relationship x20 yrs.   Relevant past medical, surgical, family and social history reviewed and updated as indicated. Interim medical history since our last visit reviewed. Allergies and medications reviewed and updated. Outpatient Medications Prior to Visit  Medication Sig Dispense Refill  . Glucosa-Chondr-Na Chondr-MSM [phone] MG TABS Take 1 tablet by mouth daily.    Marland Kitchen HYDROcodone-acetaminophen (NORCO) 10-325 MG tablet Take 1 tablet 2 (two) times daily as needed by mouth. Fill on or after 10/28/17 30 tablet 0  . Multiple Vitamin (MULTIVITAMIN) tablet Take 1 tablet by mouth daily.     No facility-administered medications prior to visit.      Per HPI unless specifically indicated in ROS section below Review of Systems     Objective:    BP 140/76 (BP Location: Right Arm, Patient Position: Sitting, Cuff Size: Large)   Pulse (!) 53   Temp 98 F (36.7 C) (Oral)   Ht 5' 11.5" (1.816 m)   Wt 242 lb (109.8 kg)   SpO2 98%   BMI 33.28 kg/m   Wt Readings from Last 3 Encounters:    02/07/18 242 lb (109.8 kg)  08/28/17 236 lb (107 kg)  12/31/16 238 lb 8 oz (108.2 kg)    Physical Exam  Constitutional: He appears well-developed and well-nourished. No distress.  Abdominal: Soft. Bowel sounds are normal. He exhibits no distension and no mass. There is no tenderness. There is no rebound and no guarding. No hernia. Hernia confirmed negative in the right inguinal area and confirmed negative in the left inguinal area.  Genitourinary: Right testis shows swelling. Right testis shows no mass and no tenderness. Right testis is descended. Left testis shows no mass, no swelling and no tenderness. Left testis is descended. Circumcised. Penile erythema present. No phimosis, paraphimosis or hypospadias. No discharge found.  Genitourinary Comments: Erythema of glans penis with swelling around foreskin without phimosis Small L hydrocele Large cystic structure at R scrotum, difficult to distinguish if associated with testicle itself  Lymphadenopathy: No inguinal adenopathy noted on the right or left side.  Nursing note and vitals reviewed.  Results for orders placed or performed in visit on 08/28/17  Hemoglobin A1c  Result Value Ref Range   Hgb A1c MFr Bld 5.1 4.6 - 6.5 %      Assessment & Plan:   Problem List Items Addressed This Visit    Balanoposthitis    Given discomfort/burning > itching will treat as bacterial balanophosthitis with mupirocin ointment. Update if  not improving with treatment.       Swelling of right testicle - Primary    Large cystic structure R scrotum which may be involving R testicle itself. No tenderness, not consistent with torsion or infection. Check scrotal US to distinguish between large hydrocele or cystic mass of testicle. Pt agrees with plan.       Relevant Orders   US Scrotum       Meds ordered this encounter  Medications  . mupirocin ointment (BACTROBAN) 2 %    Sig: Place 1 application into the nose 2 (two) times daily.    Dispense:  30 g     Refill:  0    Or cream if better covered alternative   Orders Placed This Encounter  Procedures  . US Scrotum    Standing Status:   Future    Standing Expiration Date:   04/10/2019    Order Specific Question:   Reason for Exam (SYMPTOM  OR DIAGNOSIS REQUIRED)    Answer:   R testicular/scrotal swelling    Order Specific Question:   Preferred imaging location?    Answer:   [facility]    Follow up plan: No follow-ups on file.  Eustaquio Boyden, MD

## 2018-02-07 NOTE — Assessment & Plan Note (Signed)
Large cystic structure R scrotum which may be involving R testicle itself. No tenderness, not consistent with torsion or infection. Check scrotal US to distinguish between large hydrocele or cystic mass of testicle. Pt agrees with plan.

## 2018-02-07 NOTE — Assessment & Plan Note (Signed)
Given discomfort/burning > itching will treat as bacterial balanophosthitis with mupirocin ointment. Update if not improving with treatment.

## 2018-02-07 NOTE — Patient Instructions (Addendum)
For penile rash - I think this is infection - treat with antibiotic cream sent to pharmacy twice to three times daily for the next week.  For testicular swelling - we will order scrotal ultrasound for this coming week at [facility].   Balanitis Balanitis is swelling and irritation (inflammation) of the head of the penis (glans penis). The condition may also cause inflammation of the skin around the glans penis (foreskin) in men who have not been circumcised. It may develop because of an infection or another medical condition. Balanitis occurs most often among men who have not had their foreskin removed (uncircumcised men). Balanitis sometimes causes scarring of the penis or foreskin, which can require surgery. Untreated balanitis can increase the risk of penile cancer. What are the causes? Common causes of this condition include:  Poor personal hygiene, especially in uncircumcised men. Not cleaning the glans penis and foreskin well can result in buildup of bacteria, viruses, and yeast, which can lead to infection and inflammation.  Irritation and lack of air flow due to fluid (smegma) that can build up on the glans penis.  Other causes include:  Chemical irritation from products such as soaps or shower gels (especially those that have fragrance), condoms, personal lubricants, petroleum jelly, spermicides, or fabric softeners.  Skin conditions, such as eczema, dermatitis, and psoriasis.  Allergies to medicines, such as tetracycline and sulfa drugs.  Certain medical conditions, including liver cirrhosis, congestive heart failure, diabetes, and kidney disease.  Infections, such as candidiasis, HPV (human papillomavirus), herpes simplex, gonorrhea, and syphilis.  Severe obesity.  What increases the risk? The following factors may make you more likely to develop this condition:  Having diabetes. This is the most common risk factor.  Having a tight foreskin that is difficult to pull  back (retract) past the glans.  Having sexual intercourse without using a condom.  What are the signs or symptoms? Symptoms of this condition include:  Discharge from under the foreskin.  A bad smell.  Pain or difficulty retracting the foreskin.  Tenderness, redness, and swelling of the glans.  A rash or sores on the glans or foreskin.  Itchiness.  Inability to get an erection due to pain.  Difficulty urinating.  Scarring of the penis or foreskin, in some cases.  How is this diagnosed? This condition may be diagnosed based on:  A physical exam.  Testing a swab of discharge to check for bacterial or fungal infection.  Blood tests: ? To check for viruses that can cause balanitis. ? To check your blood sugar (glucose) level. High blood glucose could be a sign of diabetes, which can cause balanitis.  How is this treated? Treatment for balanitis depends on the cause. Treatment may include:  Improving personal hygiene. Your health care provider may recommend sitting in a bath of warm water that is deep enough to cover your hips and buttocks (sitz bath).  Medicines such as: ? Creams or ointments to reduce swelling (steroids) or to treat an infection. ? Antibiotic medicine. ? Antifungal medicine.  Surgery to remove or cut the foreskin (circumcision). This may be done if you have scarring on the foreskin that makes it difficult to retract.  Controlling other medical problems that may be causing your condition or making it worse.  Follow these instructions at home:  Do not have sex until the condition clears up, or until your health care provider approves.  Keep your penis clean and dry. Take sitz baths as recommended by your health care provider.  Avoid products that irritate your skin or make symptoms worse, such as soaps and shower gels that have fragrance.  Take over-the-counter and prescription medicines only as told by your health care provider. ? If you were  prescribed an antibiotic medicine or a cream or ointment, use it as told by your health care provider. Do not stop using your medicine, cream, or ointment even if you start to feel better. ? Do not drive or use heavy machinery while taking prescription pain medicine. Contact a health care provider if:  Your symptoms get worse or do not improve with home care.  You develop chills or a fever.  You have trouble urinating.  You cannot retract your foreskin. Get help right away if:  You develop severe pain.  You are unable to urinate. Summary  Balanitis is inflammation of the head of the penis (glans penis) caused by irritation or infection.  Balanitis causes pain, redness, and swelling of the glans penis.  This condition is most common among uncircumcised men who do not keep their glans penis clean and in men who have diabetes.  Treatment may include creams or ointments.  Good hygiene is important for prevention. This includes pulling back the foreskin when washing your penis. This information is not intended to replace advice given to you by your health care provider. Make sure you discuss any questions you have with your health care provider. Document Released: 02/16/2009 Document Revised: 08/19/2016 Document Reviewed: 08/19/2016 Elsevier Interactive Patient Education  2017 [employer].

## 2018-02-12 ENCOUNTER — Ambulatory Visit
Admission: RE | Admit: 2018-02-12 | Discharge: 2018-02-12 | Disposition: A | Discharge status: Home / Self Care | Payer: Medicare HMO | Source: Ambulatory Visit | Attending: Family Medicine | Admitting: Family Medicine

## 2018-02-12 DIAGNOSIS — N5089 Other specified disorders of the male genital organs: Secondary | ICD-10-CM | POA: Diagnosis not present

## 2018-02-16 ENCOUNTER — Telehealth: Payer: Self-pay

## 2018-02-16 MED ORDER — KETOCONAZOLE 2 % EX CREA: 1.0000 "application " | TOPICAL_CREAM | Freq: Every day | CUTANEOUS | 0 refills | Status: DC

## 2018-02-16 NOTE — Telephone Encounter (Signed)
That sounds reasonable (rev last note)  Ketoconazole sent to pharmacy   F/u if no improvement

## 2018-02-16 NOTE — Telephone Encounter (Signed)
Pt notified of Dr. Royden Purl comments and instructions. Message routed to pt's PCP so she is aware

## 2018-02-16 NOTE — Telephone Encounter (Signed)
I called the pt to discuss his lab results per Dr Reece Agar. He had asked to find out how his rash was doing with Mupirocin ointment. The pt states it is not any better. Thinks he needs to try an antifungal now. Requests [facility] medication be [facility] to [facility] Dr [address] [address].  Forwarding to Dr Milinda Antis in Dr Timoteo Expose absence

## 2018-02-27 ENCOUNTER — Telehealth: Payer: Self-pay | Admitting: *Deleted

## 2018-02-27 DIAGNOSIS — N476 Balanoposthitis: Secondary | ICD-10-CM

## 2018-02-27 DIAGNOSIS — N5089 Other specified disorders of the male genital organs: Secondary | ICD-10-CM

## 2018-02-27 NOTE — Telephone Encounter (Signed)
Copied from CRM [id number]. Topic: Referral - Request >> Feb 27, 2018 10:49 AM Arlyss Gandy, NT wrote: Reason for CRM: Pt is needing a referral to urologist for the swelling in his testicles that Dr.Gutierrez saw him for a few weeks ago. Swelling is not getting better.   Last ov 01/2018

## 2018-02-27 NOTE — Telephone Encounter (Signed)
Referral placed.

## 2018-03-02 ENCOUNTER — Other Ambulatory Visit: Payer: Self-pay | Admitting: Internal Medicine

## 2018-03-03 DIAGNOSIS — N481 Balanitis: Secondary | ICD-10-CM | POA: Diagnosis not present

## 2018-03-03 DIAGNOSIS — N430 Encysted hydrocele: Secondary | ICD-10-CM | POA: Diagnosis not present

## 2018-03-03 NOTE — Telephone Encounter (Signed)
Appt made for 03/03/18 with Dr Mena Goes patient was here in person when made.

## 2018-03-06 ENCOUNTER — Other Ambulatory Visit: Payer: Self-pay | Admitting: Internal Medicine

## 2018-03-06 DIAGNOSIS — M7502 Adhesive capsulitis of left shoulder: Secondary | ICD-10-CM

## 2018-03-06 NOTE — Telephone Encounter (Signed)
He needs to make an appt for pain management appt

## 2018-03-06 NOTE — Telephone Encounter (Signed)
Requesting [facility] Last refilled # 30 on 08/28/17 with note not to be filled until 10/28/17. Last seen annual 08/28/17  01/15/17.

## 2018-03-06 NOTE — Telephone Encounter (Signed)
Copied from CRM [id number]. Topic: Quick Communication - Rx Refill/Question >> Mar 06, 2018 10:12 AM Rudi Coco, NT wrote: Medication: HYDROcodone-acetaminophen [facility]) 10-325 MG tablet [[id number]]   Has the patient contacted their pharmacy?yes (Agent: If no, request that the patient contact the pharmacy for the refill.) (Agent: If yes, when and what did the pharmacy advise?)  Preferred Pharmacy (with phone number or street name): [facility] [phone] Hassell Halim [address] [place] [address] Phone: [phone] Fax: [phone]    Agent: Please be advised that RX refills may take up to 3 business days. We ask that you follow-up with your pharmacy.

## 2018-03-06 NOTE — Telephone Encounter (Signed)
Pt has appt for 03/10/18 pain mgmt OV

## 2018-03-06 NOTE — Telephone Encounter (Signed)
Hydrocodone-acetaminophen refill Last OV: 08/28/17 Eula Flax  Last Refill:10/28/17 #30 [facility]    [address]

## 2018-03-10 ENCOUNTER — Ambulatory Visit (INDEPENDENT_AMBULATORY_CARE_PROVIDER_SITE_OTHER): Payer: Medicare HMO | Admitting: Internal Medicine

## 2018-03-10 ENCOUNTER — Encounter: Payer: Self-pay | Admitting: Internal Medicine

## 2018-03-10 VITALS — BP 120/80 | HR 62 | Temp 97.7°F | Wt 239.0 lb

## 2018-03-10 DIAGNOSIS — Z79899 Other long term (current) drug therapy: Secondary | ICD-10-CM

## 2018-03-10 DIAGNOSIS — G8929 Other chronic pain: Secondary | ICD-10-CM | POA: Diagnosis not present

## 2018-03-10 DIAGNOSIS — M25511 Pain in right shoulder: Secondary | ICD-10-CM

## 2018-03-10 DIAGNOSIS — M7502 Adhesive capsulitis of left shoulder: Secondary | ICD-10-CM

## 2018-03-10 DIAGNOSIS — M25512 Pain in left shoulder: Secondary | ICD-10-CM | POA: Diagnosis not present

## 2018-03-10 DIAGNOSIS — M545 Low back pain, unspecified: Secondary | ICD-10-CM

## 2018-03-10 MED ORDER — HYDROCODONE-ACETAMINOPHEN 10-325 MG PO TABS: 1.0000 | ORAL_TABLET | Freq: Two times a day (BID) | ORAL | 0 refills | Status: DC | PRN

## 2018-03-10 NOTE — Patient Instructions (Signed)

## 2018-03-10 NOTE — Progress Notes (Signed)
Subjective:    Patient ID: Thomas Lamb, male    DOB: 05-10-1951, 67 y.o.   MRN: [id number]  HPI  Pt presents to the clinic today for pain management evaluation. He takes Hydrocodone as needed for chronic bilateral shoulder pain/chronic back pain. MRI of the right shoulder showed:  IMPRESSION: 1. Severe acromioclavicular arthropathy with subchondral edema, cysts and possible erosions in the distal clavicle and acromion. These findings likely contribute to superior shoulder pain. 2. Mild diffuse rotator cuff tendinosis. No evidence of rotator cuff tear. 3. No evidence of labral or biceps tendon pathology.  Xray of the left shoulder showed:  IMPRESSION: Mild AC joint osteoarthritis.  He has had 2 prior surgeries on his lower back.  Indication for chronic opioid: chronic bilateral shoulder pain/chronic back pain. Medication and dose: Hydrocodone 10-325 mg, 1 tab daily prn. # pills per month: #30, about every 6 weeks, but last RX 11/2017 Last UDS date: 01/2017 Opioid Treatment Agreement signed (Y/N): 01/2017 Opioid Treatment Agreement last reviewed with patient:  03/10/2018 [facility] reviewed this encounter (include red flags):  03/10/2018   Review of Systems  Past Medical History:  Diagnosis Date  . Chicken pox     Current Outpatient Medications  Medication Sig Dispense Refill  . Glucosa-Chondr-Na Chondr-MSM [phone] MG TABS Take 1 tablet by mouth daily.    Marland Kitchen HYDROcodone-acetaminophen (NORCO) 10-325 MG tablet Take 1 tablet 2 (two) times daily as needed by mouth. Fill on or after 10/28/17 30 tablet 0  . ketoconazole (NIZORAL) 2 % cream Apply 1 application topically daily. To affected area 15 g 0  . Multiple Vitamin (MULTIVITAMIN) tablet Take 1 tablet by mouth daily.    . mupirocin ointment (BACTROBAN) 2 % Place 1 application into the nose 2 (two) times daily. 30 g 0   No current facility-administered medications for this visit.     Allergies  Allergen Reactions  .  Penicillins Hives    Family History  Problem Relation Age of Onset  . Arthritis Mother   . Arthritis Father   . Stroke Father   . Hypertension Father   . Cancer Neg Hx   . Diabetes Neg Hx   . Early death Neg Hx     Social History   Socioeconomic History  . Marital status: Married    Spouse name: Not on file  . Number of children: Not on file  . Years of education: Not on file  . Highest education level: Not on file  Occupational History  . Not on file  Social Needs  . Financial resource strain: Not on file  . Food insecurity:    Worry: Not on file    Inability: Not on file  . Transportation needs:    Medical: Not on file    Non-medical: Not on file  Tobacco Use  . Smoking status: Former Smoker    Types: Cigarettes    Last attempt to quit: 10/14/2002    Years since quitting: 15.4  . Smokeless tobacco: Never Used  Substance and Sexual Activity  . Alcohol use: Yes    Alcohol/week: 1.2 oz    Types: 2 Cans of beer per week    Comment: occasional  . Drug use: No  . Sexual activity: Not on file  Lifestyle  . Physical activity:    Days per week: Not on file    Minutes per session: Not on file  . Stress: Not on file  Relationships  . Social connections:    Talks on  phone: Not on file    Gets together: Not on file    Attends religious service: Not on file    Active member of club or organization: Not on file    Attends meetings of clubs or organizations: Not on file    Relationship status: Not on file  . Intimate partner violence:    Fear of current or ex partner: Not on file    Emotionally abused: Not on file    Physically abused: Not on file    Forced sexual activity: Not on file  Other Topics Concern  . Not on file  Social History Narrative  . Not on file     Constitutional: Denies fever, malaise, fatigue, headache or abrupt weight changes.  Musculoskeletal: Pt reports bilateral shoulder pain/back pain. Denies decrease in range of motion, difficulty with  gait, muscle pain or joint swelling.  Neurological: Denies dizziness, difficulty with memory, difficulty with speech or problems with balance and coordination.    No other specific complaints in a complete review of systems (except as listed in HPI above).     Objective:   Physical Exam    BP 120/80   Pulse 62   Temp 97.7 F (36.5 C) (Oral)   Wt 239 lb (108.4 kg)   SpO2 97%   BMI 32.87 kg/m   Wt Readings from Last 3 Encounters:  03/10/18 239 lb (108.4 kg)  02/07/18 242 lb (109.8 kg)  08/28/17 236 lb (107 kg)    General: Appears his stated age, obese in NAD. Musculoskeletal: Normal internal and external rotation of bilateral shoulders. No pain with palpation of bilateral shoulders. Negative drop can test. Normal flexion, extension, rotation and lateral bending of the lumbar spine. Mild pain with palpation over the lumbar spine. Strength 5/5 BUE/BLE. No difficulty with gait.  Neurological: Alert and oriented.   BMET    Component Value Date/Time   NA 139 08/28/2017 0844   K 5.0 08/28/2017 0844   CL 103 08/28/2017 0844   CO2 32 08/28/2017 0844   GLUCOSE 111 (H) 08/28/2017 0844   BUN 26 (H) 08/28/2017 0844   CREATININE 1.21 08/28/2017 0844   CALCIUM 10.0 08/28/2017 0844    Lipid Panel     Component Value Date/Time   CHOL 162 08/28/2017 0844   TRIG 100.0 08/28/2017 0844   HDL 40.00 08/28/2017 0844   CHOLHDL 4 08/28/2017 0844   VLDL 20.0 08/28/2017 0844   LDLCALC 102 (H) 08/28/2017 0844    CBC    Component Value Date/Time   WBC 6.7 08/28/2017 0844   RBC 5.08 08/28/2017 0844   HGB 15.0 08/28/2017 0844   HCT 44.7 08/28/2017 0844   PLT 471.0 (H) 08/28/2017 0844   MCV 87.9 08/28/2017 0844   MCHC 33.7 08/28/2017 0844   RDW 14.3 08/28/2017 0844    Hgb A1C Lab Results  Component Value Date   HGBA1C 5.1 08/28/2017          Assessment & Plan:   Encounter for Chronic Pain Management, High Risk Medication Use:  Controlled substance agreement signed  today Will obtain UDS Discussed risks of sedation, addiction with use of opioids Advised him to try Ibuprofen and Tylenol as needed and use Hydrocodone for severe pain No change in treatment at this time, will monitor  RTC in 6 months for your annual exam Nicki Reaper, NP

## 2018-03-11 NOTE — Progress Notes (Signed)
   Subjective:    Patient ID: Thomas Lamb, male    DOB: March 22, 1951, 67 y.o.   MRN: [id number]  HPI Reviewed and agree with treatment plan   Review of Systems     Objective:   Physical Exam         Assessment & Plan:

## 2018-03-16 LAB — PAIN MGMT, PROFILE 8 W/CONF, U
6 Acetylmorphine: NEGATIVE ng/mL (ref <10)
ALCOHOL METABOLITES: POSITIVE ng/mL — AB (ref <500)
AMPHETAMINES: NEGATIVE ng/mL (ref <500)
BENZODIAZEPINES: NEGATIVE ng/mL (ref <100)
BUPRENORPHINE, URINE: NEGATIVE ng/mL (ref <5)
CODEINE: NEGATIVE ng/mL (ref <50)
Cocaine Metabolite: NEGATIVE ng/mL (ref <150)
Creatinine: 136.5 mg/dL
ETHYL GLUCURONIDE (ETG): 2506 ng/mL — AB (ref <500)
Ethyl Sulfate (ETS): 951 ng/mL — ABNORMAL HIGH (ref <100)
Hydrocodone: 429 ng/mL — ABNORMAL HIGH (ref <50)
Hydromorphone: 260 ng/mL — ABNORMAL HIGH (ref <50)
MDMA: NEGATIVE ng/mL (ref <500)
MORPHINE: NEGATIVE ng/mL (ref <50)
Marijuana Metabolite: NEGATIVE ng/mL (ref <20)
NORHYDROCODONE: 438 ng/mL — AB (ref <50)
OPIATES: POSITIVE ng/mL — AB (ref <100)
OXIDANT: NEGATIVE ug/mL (ref <200)
OXYCODONE: NEGATIVE ng/mL (ref <100)
pH: 5.88 (ref 4.5–9.0)

## 2018-04-03 DIAGNOSIS — N430 Encysted hydrocele: Secondary | ICD-10-CM | POA: Diagnosis not present

## 2018-04-03 DIAGNOSIS — H3342 Traction detachment of retina, left eye: Secondary | ICD-10-CM | POA: Diagnosis not present

## 2018-04-03 DIAGNOSIS — N433 Hydrocele, unspecified: Secondary | ICD-10-CM | POA: Diagnosis not present

## 2018-05-27 ENCOUNTER — Other Ambulatory Visit: Payer: Self-pay | Admitting: Internal Medicine

## 2018-05-27 DIAGNOSIS — M7502 Adhesive capsulitis of left shoulder: Secondary | ICD-10-CM

## 2018-05-27 MED ORDER — HYDROCODONE-ACETAMINOPHEN 10-325 MG PO TABS: 1.0000 | ORAL_TABLET | Freq: Two times a day (BID) | ORAL | 0 refills | Status: DC | PRN

## 2018-05-27 NOTE — Telephone Encounter (Signed)
Copied from CRM [id number]. Topic: Quick Communication - Rx Refill/Question >> May 27, 2018  9:51 AM Herby AbrahamJohnson, Shiquita C wrote: Medication: HYDROcodone-acetaminophen (NORCO) 10-325 MG tablet  Has the patient contacted their pharmacy? No  (Agent: If no, request that the patient contact the pharmacy for the refill.) (Agent: If yes, when and what did the pharmacy advise?)  Preferred Pharmacy (with phone number or street name): [id number] - [address] [phone] (Phone) [phone] (Fax)    Agent: Please be advised that RX refills may take up to 3 business days. We ask that you follow-up with your pharmacy.

## 2018-05-27 NOTE — Addendum Note (Signed)
Addended by: Desmond DikeKNIGHT, Neo Yepiz H on: 05/27/2018 10:17 AM   Modules accepted: Orders

## 2018-05-27 NOTE — Telephone Encounter (Signed)
Hydrocodone-acetaminophen 10-325 refill Last Refill:03/10/18 # 30 Last OV: 03/10/18 PCP: Nicki Reaperegina Baity [facility] [address] [place]

## 2018-06-09 DIAGNOSIS — N430 Encysted hydrocele: Secondary | ICD-10-CM | POA: Diagnosis not present

## 2018-06-16 DIAGNOSIS — N430 Encysted hydrocele: Secondary | ICD-10-CM | POA: Diagnosis not present

## 2018-07-24 ENCOUNTER — Other Ambulatory Visit: Payer: Self-pay | Admitting: Internal Medicine

## 2018-07-24 DIAGNOSIS — M7502 Adhesive capsulitis of left shoulder: Secondary | ICD-10-CM

## 2018-07-24 DIAGNOSIS — N433 Hydrocele, unspecified: Secondary | ICD-10-CM | POA: Insufficient documentation

## 2018-07-24 DIAGNOSIS — N4340 Spermatocele of epididymis, unspecified: Secondary | ICD-10-CM | POA: Insufficient documentation

## 2018-07-24 MED ORDER — HYDROCODONE-ACETAMINOPHEN 10-325 MG PO TABS: 1.0000 | ORAL_TABLET | Freq: Two times a day (BID) | ORAL | 0 refills | Status: DC | PRN

## 2018-07-24 NOTE — Telephone Encounter (Signed)
Pt notified hydrocodone apap 10-325 mg was sent [facility] to [facility].pt voiced understanding.

## 2018-07-24 NOTE — Telephone Encounter (Signed)
Copied from CRM [id number]. Topic: Quick Communication - Rx Refill/Question >> Jul 24, 2018  2:59 PM Jonette Eva wrote: Medication: HYDROcodone-acetaminophen (NORCO) 10-325 MG tablet [[id number]]   Has the patient contacted their pharmacy? Yes.   (Agent: If no, request that the patient contact the pharmacy for the refill.) (Agent: If yes, when and what did the pharmacy advise?)  [facility]   Agent: Please be advised that RX refills may take up to 3 business days. We ask that you follow-up with your pharmacy.

## 2018-07-24 NOTE — Telephone Encounter (Signed)
Name of Medication: Hydrocodone apap 10-325 Name of Pharmacy: [facility] Last Fill or Written Date and Quantity: # 30 on 05/27/18 Last Office Visit and Type: pain mgt on 03/10/18 Next Office Visit and Type: none scheduled Last Controlled Substance Agreement Date: 03/12/18 Last UDS:03/10/18

## 2018-08-04 DIAGNOSIS — N433 Hydrocele, unspecified: Secondary | ICD-10-CM | POA: Diagnosis not present

## 2018-08-04 DIAGNOSIS — N4340 Spermatocele of epididymis, unspecified: Secondary | ICD-10-CM | POA: Diagnosis not present

## 2018-09-09 ENCOUNTER — Other Ambulatory Visit: Payer: Self-pay | Admitting: *Deleted

## 2018-09-09 DIAGNOSIS — M7502 Adhesive capsulitis of left shoulder: Secondary | ICD-10-CM

## 2018-09-09 MED ORDER — HYDROCODONE-ACETAMINOPHEN 10-325 MG PO TABS: 1.0000 | ORAL_TABLET | Freq: Two times a day (BID) | ORAL | 0 refills | Status: DC | PRN

## 2018-09-09 NOTE — Telephone Encounter (Signed)
Last filled 07/24/2018.... Last UDS/CSA 03/10/2018.... Please advise.Marland Kitchen..Marland Kitchen

## 2018-11-03 ENCOUNTER — Other Ambulatory Visit: Payer: Self-pay

## 2018-11-03 DIAGNOSIS — M7502 Adhesive capsulitis of left shoulder: Secondary | ICD-10-CM

## 2018-11-03 MED ORDER — HYDROCODONE-ACETAMINOPHEN 10-325 MG PO TABS: 1.0000 | ORAL_TABLET | Freq: Two times a day (BID) | ORAL | 0 refills | Status: DC | PRN

## 2018-11-03 NOTE — Telephone Encounter (Signed)
Name of Medication: Hydrocodone apap 10-325 mg Name of Pharmacy:[facility]  Last Fill or Written Date and Quantity: # 30 on 09/09/18 Last Office Visit and Type: 03/10/18 for pain Next Office Visit and Type: none scheduled Last Controlled Substance Agreement Date: 03/12/18 Last UDS:03/10/18

## 2018-12-24 DIAGNOSIS — H25819 Combined forms of age-related cataract, unspecified eye: Secondary | ICD-10-CM | POA: Diagnosis not present

## 2018-12-25 ENCOUNTER — Other Ambulatory Visit: Payer: Self-pay

## 2018-12-25 DIAGNOSIS — M7502 Adhesive capsulitis of left shoulder: Secondary | ICD-10-CM

## 2018-12-25 MED ORDER — HYDROCODONE-ACETAMINOPHEN 10-325 MG PO TABS: 1.0000 | ORAL_TABLET | Freq: Two times a day (BID) | ORAL | 0 refills | Status: DC | PRN

## 2018-12-25 NOTE — Telephone Encounter (Signed)
Past due for medicare wellness and 6 months follow up for pain management.

## 2018-12-25 NOTE — Telephone Encounter (Signed)
Name of Medication: [facility]codone apap 10-[facility]  Last Fill or Written Date and Quantity: #30 on 11/03/18 Last Office Visit and Type: 03/10/18 pain mgt Next Office Visit and Type: 02/04/19 CPX Last Controlled Substance Agreement Date:03/12/18  Last UDS:03/11/18

## 2018-12-25 NOTE — Telephone Encounter (Signed)
Pt has appt for 01/2019

## 2018-12-31 ENCOUNTER — Telehealth: Payer: Self-pay

## 2018-12-31 NOTE — Telephone Encounter (Signed)
Pt went to get his Hydrocodone and they told him he needed a PA. He is out of medication as of this morning.

## 2018-12-31 NOTE — Telephone Encounter (Signed)
PA has been submitted via covermymeds, awaiting on response

## 2019-01-04 NOTE — Telephone Encounter (Signed)
Medication was approved  Effective from 12/31/2018 through 01/29/2019

## 2019-01-17 ENCOUNTER — Emergency Department
Admission: EM | Admit: 2019-01-17 | Discharge: 2019-01-17 | Disposition: A | Discharge status: Home / Self Care | Payer: BLUE CROSS/BLUE SHIELD | Attending: Emergency Medicine | Admitting: Emergency Medicine

## 2019-01-17 ENCOUNTER — Other Ambulatory Visit: Payer: Self-pay

## 2019-01-17 ENCOUNTER — Encounter: Payer: Self-pay | Admitting: Emergency Medicine

## 2019-01-17 DIAGNOSIS — Y929 Unspecified place or not applicable: Secondary | ICD-10-CM | POA: Diagnosis not present

## 2019-01-17 DIAGNOSIS — S61211A Laceration without foreign body of left index finger without damage to nail, initial encounter: Secondary | ICD-10-CM | POA: Insufficient documentation

## 2019-01-17 DIAGNOSIS — Z79899 Other long term (current) drug therapy: Secondary | ICD-10-CM | POA: Insufficient documentation

## 2019-01-17 DIAGNOSIS — Z87891 Personal history of nicotine dependence: Secondary | ICD-10-CM | POA: Diagnosis not present

## 2019-01-17 DIAGNOSIS — W312XXA Contact with powered woodworking and forming machines, initial encounter: Secondary | ICD-10-CM | POA: Diagnosis not present

## 2019-01-17 DIAGNOSIS — Y999 Unspecified external cause status: Secondary | ICD-10-CM | POA: Insufficient documentation

## 2019-01-17 DIAGNOSIS — Y939 Activity, unspecified: Secondary | ICD-10-CM | POA: Diagnosis not present

## 2019-01-17 MED ORDER — LIDOCAINE HCL (PF) 1 % IJ SOLN
5.0000 mL | Freq: Once | INTRAMUSCULAR | Status: AC
  Administered 2019-01-17: 5 mL
  Filled 2019-01-17: qty 5

## 2019-01-17 NOTE — ED Provider Notes (Signed)
[facility] Emergency Department Provider Note ____________________________________________  Time seen: 1109  I have reviewed the triage vital signs and the nursing notes.  HISTORY  Chief Complaint  Finger Laceration  HPI Thomas Lamb is a 68 y.o. male presents to the ED for evaluation of an accidental finger injury. He was using a table saw when he accidentally cut his left index finger. He presents with a bandage in place. He reports a current tetanus status.   Past Medical History:  Diagnosis Date  . Chicken pox     Patient Active Problem List   Diagnosis Date Noted  . Chronic back pain 08/28/2017  . Adhesive capsulitis of left shoulder 11/25/2014    Past Surgical History:  Procedure Laterality Date  . APPENDECTOMY  2007  . BACK SURGERY  [phone]  . KNEE SURGERY Left 1966   calcium removal  . TONSILLECTOMY AND ADENOIDECTOMY  1957    Prior to Admission medications   Medication Sig Start Date End Date Taking? Authorizing Provider  Glucosa-Chondr-Na Chondr-MSM [phone] MG TABS Take 1 tablet by mouth daily.    [provider]  HYDROcodone-acetaminophen (NORCO) 10-325 MG tablet Take 1 tablet by mouth 2 (two) times daily as needed. MUST SCHEDULE ANNUAL EXAM 12/25/18   Lorre Munroe, NP  Multiple Vitamin (MULTIVITAMIN) tablet Take 1 tablet by mouth daily.    [provider]  nystatin-triamcinolone ointment (MYCOLOG) APPLY TO AFFECTED AREA TWICE A DAY 03/03/18   [provider]    Allergies Penicillins  Family History  Problem Relation Age of Onset  . Arthritis Mother   . Arthritis Father   . Stroke Father   . Hypertension Father   . Cancer Neg Hx   . Diabetes Neg Hx   . Early death Neg Hx     Social History Social History   Tobacco Use  . Smoking status: Former Smoker    Types: Cigarettes    Last attempt to quit: 10/14/2002    Years since quitting: 16.2  . Smokeless tobacco: Never Used  Substance  Use Topics  . Alcohol use: Yes    Alcohol/week: 2.0 standard drinks    Types: 2 Cans of beer per week    Comment: occasional  . Drug use: No    Review of Systems  Constitutional: Negative for fever. Cardiovascular: Negative for chest pain. Respiratory: Negative for shortness of breath. Musculoskeletal: Negative for back pain. Left index finger laceration. Skin: Negative for rash. Neurological: Negative for headaches, focal weakness or numbness. ____________________________________________  PHYSICAL EXAM:  VITAL SIGNS: ED Triage Vitals [01/17/19 1036]  Enc Vitals Group     BP (!) 152/85     Pulse Rate 62     Resp 18     Temp 97.6 F (36.4 C)     Temp Source Oral     SpO2 98 %     Weight 230 lb (104.3 kg)     Height 6' (1.829 m)     Head Circumference      Peak Flow      Pain Score 9     Pain Loc      Pain Edu?      Excl. in GC?     Constitutional: Alert and oriented. Well appearing and in no distress. Head: Normocephalic and atraumatic. Eyes: Conjunctivae are normal. Normal extraocular movements Cardiovascular: Normal rate, regular rhythm. Normal distal pulses. Respiratory: Normal respiratory effort.  Musculoskeletal: left hand without deformity or amputation. Left index finger with a  superficial laceration to the radial aspect of the distal phalanx. No nailbed injury noted. Normal composite fist. Nontender with normal range of motion in all extremities.  Neurologic:  Normal gross sensation. Normal speech and language. No gross focal neurologic deficits are appreciated. Skin:  Skin is warm, dry and intact. No rash noted. ____________________________________________  PROCEDURES  Procedures NERVE BLOCK Performed by: Lissa Hoard Consent: Verbal consent obtained. Required items: required blood products, implants, devices, and special equipment available Time out: Immediately prior to procedure a "time out" was called to verify the correct patient,  procedure, equipment, support staff and site/side marked as required.  Indication: pain Nerve block body site: left index flexor tendon sheath (transthecal block)  Preparation: Patient was prepped and draped in the usual sterile fashion. Needle gauge: 27 G Location technique: anatomical landmarks  Local anesthetic: 1% lido w/o epi  Anesthetic total: 3 ml  Outcome: pain improved Patient tolerance: Patient tolerated the procedure well with no immediate complications. The wound was cleansed and explored. A superficial laceration without need for suture repair. Petroleum-gauze and dry gauze applied. ____________________________________________  INITIAL IMPRESSION / ASSESSMENT AND PLAN / ED COURSE  Patient with ED evaluation of a left index finger laceration. His wound is superficial, and does not require suture repair. He wound is cleansed and dressed. He is discharged with wound care instructions and supplies.  ____________________________________________  FINAL CLINICAL IMPRESSION(S) / ED DIAGNOSES  Final diagnoses:  Laceration of left index finger without foreign body without damage to nail, initial encounter     Lissa Hoard, PA-C 01/17/19 1313    Phineas Semen, MD 01/17/19 1344

## 2019-01-17 NOTE — ED Triage Notes (Signed)
Pt presents to ED via POV with c/o laceration to L index finger. Bleeding controlled in triage. Pt c/o injury to "tip" of L index finger.

## 2019-01-17 NOTE — ED Notes (Signed)
See triage note  Presents with laceration to left index finger this am  States he was using a saw  Slipped and caught tip of finger

## 2019-01-17 NOTE — Discharge Instructions (Addendum)
Keep the wound clean, dry, and covered. Take OTC Tylenol and Ibuprofen for pain relief. Follow-up with your provider for ongoing symptoms.

## 2019-02-04 ENCOUNTER — Encounter: Payer: Self-pay | Admitting: Internal Medicine

## 2019-02-04 ENCOUNTER — Encounter: Payer: BLUE CROSS/BLUE SHIELD | Admitting: Internal Medicine

## 2019-02-04 ENCOUNTER — Ambulatory Visit (INDEPENDENT_AMBULATORY_CARE_PROVIDER_SITE_OTHER): Payer: BLUE CROSS/BLUE SHIELD | Admitting: Internal Medicine

## 2019-02-04 DIAGNOSIS — M7502 Adhesive capsulitis of left shoulder: Secondary | ICD-10-CM | POA: Diagnosis not present

## 2019-02-04 DIAGNOSIS — G8929 Other chronic pain: Secondary | ICD-10-CM | POA: Diagnosis not present

## 2019-02-04 DIAGNOSIS — M545 Low back pain: Secondary | ICD-10-CM

## 2019-02-04 DIAGNOSIS — M25512 Pain in left shoulder: Secondary | ICD-10-CM

## 2019-02-04 DIAGNOSIS — M25511 Pain in right shoulder: Secondary | ICD-10-CM | POA: Diagnosis not present

## 2019-02-04 MED ORDER — HYDROCODONE-ACETAMINOPHEN 10-325 MG PO TABS: 1.0000 | ORAL_TABLET | Freq: Two times a day (BID) | ORAL | 0 refills | Status: DC | PRN

## 2019-02-04 NOTE — Assessment & Plan Note (Signed)
Encouraged regular stretching a physical activity Continue Hydrocodone prn, refilled today NCCSR reviewed

## 2019-02-04 NOTE — Progress Notes (Signed)
Virtual Visit via Video Note  I connected with Thomas Lamb on 02/04/19 at  2:15 PM EDT by a video enabled telemedicine application and verified that I am speaking with the correct person using two identifiers.   I discussed the limitations of evaluation and management by telemedicine and the availability of in person appointments. The patient expressed understanding and agreed to proceed.  Patient Location: Home Provider Location: Office  History of Present Illness:  Pt presents to the clinic today for pain management evaluation. He takes Hydrocodone as needed for chronic bilateral shoulder pain/chronic back pain. MRI of right shoulder showed.    IMPRESSION: 1. Severe acromioclavicular arthropathy with subchondral edema, cysts and possible erosions in the distal clavicle and acromion. These findings likely contribute to superior shoulder pain. 2. Mild diffuse rotator cuff tendinosis. No evidence of rotator cuff tear. 3. No evidence of labral or biceps tendon pathology.   Xray of the left shoulder showed:   IMPRESSION: Mild AC joint osteoarthritis.   He has had 2 prior surgeries on his lower back.   Indication for chronic opioid: chronic bilateral shoulder pain/chronic back pain Medication and dose: Hydrocodone 5-325 mg 1 tab PO daily prn # pills per month: #30, about every 6 weeks, but last RX 12/24/28 Last UDS date: 02/2018 Opioid Treatment Agreement signed (Y/N): 02/2018 Opioid Treatment Agreement last reviewed with patient:  03/10/2018 [facility] reviewed this encounter (include red flags):  02/04/2019   Past Medical History:  Diagnosis Date  . Chicken pox     Current Outpatient Medications  Medication Sig Dispense Refill  . Glucosa-Chondr-Na Chondr-MSM [phone] MG TABS Take 1 tablet by mouth daily.    Marland Kitchen HYDROcodone-acetaminophen (NORCO) 10-325 MG tablet Take 1 tablet by mouth 2 (two) times daily as needed. MUST SCHEDULE ANNUAL EXAM 30 tablet 0  . Multiple Vitamin  (MULTIVITAMIN) tablet Take 1 tablet by mouth daily.    Marland Kitchen nystatin-triamcinolone ointment (MYCOLOG) APPLY TO AFFECTED AREA TWICE A DAY  0   No current facility-administered medications for this visit.     Allergies  Allergen Reactions  . Penicillins Hives    Family History  Problem Relation Age of Onset  . Arthritis Mother   . Arthritis Father   . Stroke Father   . Hypertension Father   . Cancer Neg Hx   . Diabetes Neg Hx   . Early death Neg Hx     Social History   Socioeconomic History  . Marital status: Married    Spouse name: Not on file  . Number of children: Not on file  . Years of education: Not on file  . Highest education level: Not on file  Occupational History  . Not on file  Social Needs  . Financial resource strain: Not on file  . Food insecurity:    Worry: Not on file    Inability: Not on file  . Transportation needs:    Medical: Not on file    Non-medical: Not on file  Tobacco Use  . Smoking status: Former Smoker    Types: Cigarettes    Last attempt to quit: 10/14/2002    Years since quitting: 16.3  . Smokeless tobacco: Never Used  Substance and Sexual Activity  . Alcohol use: Yes    Alcohol/week: 2.0 standard drinks    Types: 2 Cans of beer per week    Comment: occasional  . Drug use: No  . Sexual activity: Not on file  Lifestyle  . Physical activity:    Days  per week: Not on file    Minutes per session: Not on file  . Stress: Not on file  Relationships  . Social connections:    Talks on phone: Not on file    Gets together: Not on file    Attends religious service: Not on file    Active member of club or organization: Not on file    Attends meetings of clubs or organizations: Not on file    Relationship status: Not on file  . Intimate partner violence:    Fear of current or ex partner: Not on file    Emotionally abused: Not on file    Physically abused: Not on file    Forced sexual activity: Not on file  Other Topics Concern  . Not  on file  Social History Narrative  . Not on file     Constitutional: Denies fever, malaise, fatigue, headache or abrupt weight changes.  Respiratory: Denies difficulty breathing, shortness of breath, cough or sputum production.   Cardiovascular: Denies chest pain, chest tightness, palpitations or swelling in the hands or feet.  Gastrointestinal: Denies abdominal pain, bloating, constipation, diarrhea or blood in the stool.  GU: Denies urgency, frequency, pain with urination, burning sensation, blood in urine, odor or discharge. Musculoskeletal: Pt reports bilateral shoulder, lower back pain. Denies decrease in range of motion, difficulty with gait, or joint  swelling.  Neurological: Denies dizziness, difficulty with memory, difficulty with speech or problems with balance and coordination.    No other specific complaints in a complete review of systems (except as listed in HPI above).  Wt Readings from Last 3 Encounters:  01/17/19 230 lb (104.3 kg)  03/10/18 239 lb (108.4 kg)  02/07/18 242 lb (109.8 kg)    General: Appears his stated age, well developed, well nourished in NAD. Pulmonary/Chest: Normal effort. No respiratory distress.  Musculoskeletal: Decreased external rotation of bilateral shoulders. Normal internal rotation of bilateral shoulders. Normal flexion, extension and rotation of the spine. Neurological: Alert and oriented. Psychiatric: Mood and affect normal. Behavior is normal. Judgment and thought content normal.     BMET    Component Value Date/Time   NA 139 08/28/2017 0844   K 5.0 08/28/2017 0844   CL 103 08/28/2017 0844   CO2 32 08/28/2017 0844   GLUCOSE 111 (H) 08/28/2017 0844   BUN 26 (H) 08/28/2017 0844   CREATININE 1.21 08/28/2017 0844   CALCIUM 10.0 08/28/2017 0844    Lipid Panel     Component Value Date/Time   CHOL 162 08/28/2017 0844   TRIG 100.0 08/28/2017 0844   HDL 40.00 08/28/2017 0844   CHOLHDL 4 08/28/2017 0844   VLDL 20.0 08/28/2017  0844   LDLCALC 102 (H) 08/28/2017 0844    CBC    Component Value Date/Time   WBC 6.7 08/28/2017 0844   RBC 5.08 08/28/2017 0844   HGB 15.0 08/28/2017 0844   HCT 44.7 08/28/2017 0844   PLT 471.0 (H) 08/28/2017 0844   MCV 87.9 08/28/2017 0844   MCHC 33.7 08/28/2017 0844   RDW 14.3 08/28/2017 0844    Hgb A1C Lab Results  Component Value Date   HGBA1C 5.1 08/28/2017        Assessment and Plan:  See problem based charting  Follow Up Instructions:    I discussed the assessment and treatment plan with the patient. The patient was provided an opportunity to ask questions and all were answered. The patient agreed with the plan and demonstrated an understanding of the instructions.  The patient was advised to call back or seek an in-person evaluation if the symptoms worsen or if the condition fails to improve as anticipated.  Nicki Reaper, NP

## 2019-02-04 NOTE — Progress Notes (Signed)
   Subjective:    Patient ID: Thomas Lamb, male    DOB: Nov 11, 1950, 68 y.o.   MRN: [id number]  HPI I reviewed NP's note, was available for consultation, and agree with documentation and plan.    Review of Systems     Objective:   Physical Exam         Assessment & Plan:

## 2019-02-04 NOTE — Assessment & Plan Note (Signed)
Encouraged regular stretching a physical activity Continue Hydrocodone prn, refilled today [facility] reviewed 

## 2019-02-04 NOTE — Patient Instructions (Signed)

## 2019-04-09 ENCOUNTER — Other Ambulatory Visit: Payer: Self-pay

## 2019-04-09 DIAGNOSIS — M7502 Adhesive capsulitis of left shoulder: Secondary | ICD-10-CM

## 2019-04-09 MED ORDER — HYDROCODONE-ACETAMINOPHEN 10-325 MG PO TABS: 1.0000 | ORAL_TABLET | Freq: Two times a day (BID) | ORAL | 0 refills | Status: DC | PRN

## 2019-04-09 NOTE — Telephone Encounter (Signed)
[facility] - Client Nonclinical Telephone Record [facility] Client [facility] - Client Client Site [place] Physician Webb Silversmith - NP Contact Type Call Who Is Calling Patient / Member / Family / Caregiver Caller Name Rashaun Wichert Caller Phone Number [phone] Patient Name Thomas Lamb Patient DOB 03-Apr-1951 Call Type Message Only Information Provided Reason for Call Medication Question / Request Initial Comment Caller states needs to get a refill for prescription. Additional Comment Call Closed By: Waymond Cera Transaction Date/Time: 04/09/2019 7:38:57 AM (ET)

## 2019-04-09 NOTE — Telephone Encounter (Signed)
Name of Medication: Hydrocodone apap 10-325 mg Name of Pharmacy: [facility] or Written Date and Quantity: # 30 on 02/04/19. Last Office Visit and Type: 02/04/19 pain mgt Next Office Visit and Type: 05/11/2019 [facility] Last Controlled Substance Agreement Date: 03/12/18 Last UDS:03/10/2018

## 2019-05-11 ENCOUNTER — Ambulatory Visit (INDEPENDENT_AMBULATORY_CARE_PROVIDER_SITE_OTHER): Payer: BC Managed Care – PPO | Admitting: Internal Medicine

## 2019-05-11 ENCOUNTER — Other Ambulatory Visit: Payer: Self-pay

## 2019-05-11 ENCOUNTER — Encounter: Payer: Self-pay | Admitting: Internal Medicine

## 2019-05-11 VITALS — BP 126/82 | HR 69 | Temp 97.7°F | Ht 71.5 in | Wt 229.0 lb

## 2019-05-11 DIAGNOSIS — B880 Other acariasis: Secondary | ICD-10-CM

## 2019-05-11 DIAGNOSIS — Z0001 Encounter for general adult medical examination with abnormal findings: Secondary | ICD-10-CM | POA: Diagnosis not present

## 2019-05-11 DIAGNOSIS — Z125 Encounter for screening for malignant neoplasm of prostate: Secondary | ICD-10-CM | POA: Diagnosis not present

## 2019-05-11 DIAGNOSIS — H9193 Unspecified hearing loss, bilateral: Secondary | ICD-10-CM

## 2019-05-11 DIAGNOSIS — M545 Low back pain, unspecified: Secondary | ICD-10-CM

## 2019-05-11 DIAGNOSIS — M25511 Pain in right shoulder: Secondary | ICD-10-CM | POA: Diagnosis not present

## 2019-05-11 DIAGNOSIS — Z79899 Other long term (current) drug therapy: Secondary | ICD-10-CM

## 2019-05-11 DIAGNOSIS — M25512 Pain in left shoulder: Secondary | ICD-10-CM

## 2019-05-11 DIAGNOSIS — T63421A Toxic effect of venom of ants, accidental (unintentional), initial encounter: Secondary | ICD-10-CM

## 2019-05-11 DIAGNOSIS — G8929 Other chronic pain: Secondary | ICD-10-CM

## 2019-05-11 LAB — COMPREHENSIVE METABOLIC PANEL
ALT: 22 U/L (ref 0–53)
AST: 18 U/L (ref 0–37)
Albumin: 4.6 g/dL (ref 3.5–5.2)
Alkaline Phosphatase: 56 U/L (ref 39–117)
BUN: 21 mg/dL (ref 6–23)
CO2: 30 mEq/L (ref 19–32)
Calcium: 9.7 mg/dL (ref 8.4–10.5)
Chloride: 104 mEq/L (ref 96–112)
Creatinine, Ser: 1.21 mg/dL (ref 0.40–1.50)
GFR: 59.46 mL/min — ABNORMAL LOW (ref >60.00)
Glucose, Bld: 101 mg/dL — ABNORMAL HIGH (ref 70–99)
Potassium: 4.6 mEq/L (ref 3.5–5.1)
Sodium: 140 mEq/L (ref 135–145)
Total Bilirubin: 0.7 mg/dL (ref 0.2–1.2)
Total Protein: 6.8 g/dL (ref 6.0–8.3)

## 2019-05-11 LAB — CBC
HCT: 43.4 % (ref 39.0–52.0)
Hemoglobin: 14.5 g/dL (ref 13.0–17.0)
MCHC: 33.4 g/dL (ref 30.0–36.0)
MCV: 90.7 fl (ref 78.0–100.0)
Platelets: 463 10*3/uL — ABNORMAL HIGH (ref 150.0–400.0)
RBC: 4.78 Mil/uL (ref 4.22–5.81)
RDW: 14.4 % (ref 11.5–15.5)
WBC: 6.7 10*3/uL (ref 4.0–10.5)

## 2019-05-11 LAB — LIPID PANEL
Cholesterol: 163 mg/dL (ref 0–200)
HDL: 39.3 mg/dL (ref >39.00)
LDL Cholesterol: 95 mg/dL (ref 0–99)
NonHDL: 123.49
Total CHOL/HDL Ratio: 4
Triglycerides: 144 mg/dL (ref 0.0–149.0)
VLDL: 28.8 mg/dL (ref 0.0–40.0)

## 2019-05-11 LAB — PSA: PSA: 0.49 ng/mL (ref 0.10–4.00)

## 2019-05-11 MED ORDER — HYDROCODONE-ACETAMINOPHEN 10-325 MG PO TABS: 1.0000 | ORAL_TABLET | Freq: Every day | ORAL | 0 refills | Status: DC | PRN

## 2019-05-11 NOTE — Assessment & Plan Note (Signed)
CSA and UDS today Encouraged regular stretching, exercise for weight loss and core strengthening Norco refilled today

## 2019-05-11 NOTE — Addendum Note (Signed)
Addended by: Lurlean Nanny on: 05/11/2019 02:23 PM   Modules accepted: Orders

## 2019-05-11 NOTE — Assessment & Plan Note (Signed)
CSA and UDS today Encouraged regular stretching, exercise for weight loss and core strengthening Norco refilled today 

## 2019-05-11 NOTE — Progress Notes (Signed)
Subjective:    Patient ID: Thomas Lamb, male    DOB: [birth year], 68 y.o.   MRN: [id number]  HPI  Pt presents to the clinic today for his annual exam. He is also due to follow up chronic pain.  IMPRESSION: 1. Severe acromioclavicular arthropathy with subchondral edema, cysts and possible erosions in the distal clavicle and acromion. These findings likely contribute to superior shoulder pain. 2. Mild diffuse rotator cuff tendinosis. No evidence of rotator cuff tear. 3. No evidence of labral or biceps tendon pathology.  Xray of the left shoulder showed:  IMPRESSION: Mild AC joint osteoarthritis.  He has had 2 prior surgeries onhis lower back.  Indication for chronic opioid: Chronic pain of both shoulders/chronic low back pain. Medication and dose: Hydrocodone 10-325 mg tab, 1 tab PO daily prn # pills per month: 30 Last UDS date: 01/2017 Opioid Treatment Agreement signed (Y/N): yes Opioid Treatment Agreement last reviewed with patient:  05/11/2019 [facility] reviewed this encounter (include red flags):  05/11/2019, no red flags  Flu: never Tetanus: < 10 years ago per his report Pneumovax: never Prevnar: never Shingrix: never PSA Screening: never Colon Screening: never Vision Screening: as needed Dentist: annually  Diet: He does eat meat. He consumes fruits and veggies daily. He does eat some fried foods. He drinks mostly water. Exercise: Yardwork  Review of Systems      Past Medical History:  Diagnosis Date  . Chicken pox     Current Outpatient Medications  Medication Sig Dispense Refill  . Glucosa-Chondr-Na Chondr-MSM [phone] MG TABS Take 1 tablet by mouth daily.    Marland Kitchen. HYDROcodone-acetaminophen (NORCO) 10-325 MG tablet Take 1 tablet by mouth 2 (two) times daily as needed. 30 tablet 0  . Multiple Vitamin (MULTIVITAMIN) tablet Take 1 tablet by mouth daily.     No current facility-administered medications for this visit.     Allergies  Allergen  Reactions  . Penicillins Hives    Family History  Problem Relation Age of Onset  . Arthritis Mother   . Arthritis Father   . Stroke Father   . Hypertension Father   . Cancer Neg Hx   . Diabetes Neg Hx   . Early death Neg Hx     Social History   Socioeconomic History  . Marital status: Married    Spouse name: Not on file  . Number of children: Not on file  . Years of education: Not on file  . Highest education level: Not on file  Occupational History  . Not on file  Social Needs  . Financial resource strain: Not on file  . Food insecurity    Worry: Not on file    Inability: Not on file  . Transportation needs    Medical: Not on file    Non-medical: Not on file  Tobacco Use  . Smoking status: Former Smoker    Types: Cigarettes    Quit date: 10/14/2002    Years since quitting: 16.5  . Smokeless tobacco: Never Used  Substance and Sexual Activity  . Alcohol use: Yes    Alcohol/week: 2.0 standard drinks    Types: 2 Cans of beer per week    Comment: occasional  . Drug use: No  . Sexual activity: Not on file  Lifestyle  . Physical activity    Days per week: Not on file    Minutes per session: Not on file  . Stress: Not on file  Relationships  . Social connections    Talks  on phone: Not on file    Gets together: Not on file    Attends religious service: Not on file    Active member of club or organization: Not on file    Attends meetings of clubs or organizations: Not on file    Relationship status: Not on file  . Intimate partner violence    Fear of current or ex partner: Not on file    Emotionally abused: Not on file    Physically abused: Not on file    Forced sexual activity: Not on file  Other Topics Concern  . Not on file  Social History Narrative  . Not on file     Constitutional: Denies fever, malaise, fatigue, headache or abrupt weight changes.  HEENT: Pt reports decreased hearing bilaterally. Denies eye pain, eye redness, ear pain, ringing in the  ears, wax buildup, runny nose, nasal congestion, bloody nose, or sore throat. Respiratory: Denies difficulty breathing, shortness of breath, cough or sputum production.   Cardiovascular: Denies chest pain, chest tightness, palpitations or swelling in the hands or feet.  Gastrointestinal: Denies abdominal pain, bloating, constipation, diarrhea or blood in the stool.  GU: Denies urgency, frequency, pain with urination, burning sensation, blood in urine, odor or discharge. Musculoskeletal: Pt reports chronic shoulder, low back pain. Pt reports nodule of left ring finger. Denies decrease in range of motion, difficulty with gait, muscle pain or joint swelling.  Skin: Pt reports rash of left ankle, abnormal mole of right upper back.  Neurological: Denies dizziness, difficulty with memory, difficulty with speech or problems with balance and coordination.  Psych: Denies anxiety, depression, SI/HI.  No other specific complaints in a complete review of systems (except as listed in HPI above).  Objective:   Physical Exam  BP 126/82   Pulse 69   Temp 97.7 F (36.5 C) (Oral)   Ht 5' 11.5" (1.816 m)   Wt 229 lb (103.9 kg)   SpO2 98%   BMI 31.49 kg/m  Wt Readings from Last 3 Encounters:  05/11/19 229 lb (103.9 kg)  02/04/19 230 lb (104.3 kg)  01/17/19 230 lb (104.3 kg)    General: Appears his stated age, obese, in NAD. Skin: Warm, dry and intact. Fire ant bites noted around left ankle. Chigger bite noted of right lower extremity. 0.5 cm oval hyperpigmented, raised scaly nevus vs SK noted of right upper back.  HEENT: Head: normal shape and size; Eyes: sclera white, no icterus, conjunctiva pink, PERRLA and EOMs intact; Ears: Tm's gray and intact, normal light reflex, mild wax buildup in right ear;  Neck:  Neck supple, trachea midline. No masses, lumps or thyromegaly present.  Cardiovascular: Normal rate and rhythm. S1,S2 noted.  No murmur, rubs or gallops noted. No JVD or BLE edema. No carotid  bruits noted. Pulmonary/Chest: Normal effort and positive vesicular breath sounds. No respiratory distress. No wheezes, rales or ronchi noted.  Abdomen: Soft and nontender. Normal bowel sounds. No distention or masses noted. Liver, spleen and kidneys non palpable. Musculoskeletal: Decreased external rotation of bilateral shoulders. Normal internal rotation. Normal flexion, extension and rotation of the spine. Bony tenderness noted over the lumbar spine. Strength 5/5 BUE/BLE. No difficulty with gait. Calcium deposit noted at base of left ring finger. Neurological: Alert and oriented. Cranial nerves II-XII grossly intact. Coordination normal.  Psychiatric: Mood and affect normal. Behavior is normal. Judgment and thought content normal.     BMET    Component Value Date/Time   NA 139 08/28/2017 0844  K 5.0 08/28/2017 0844   CL 103 08/28/2017 0844   CO2 32 08/28/2017 0844   GLUCOSE 111 (H) 08/28/2017 0844   BUN 26 (H) 08/28/2017 0844   CREATININE 1.21 08/28/2017 0844   CALCIUM 10.0 08/28/2017 0844    Lipid Panel     Component Value Date/Time   CHOL 162 08/28/2017 0844   TRIG 100.0 08/28/2017 0844   HDL 40.00 08/28/2017 0844   CHOLHDL 4 08/28/2017 0844   VLDL 20.0 08/28/2017 0844   LDLCALC 102 (H) 08/28/2017 0844    CBC    Component Value Date/Time   WBC 6.7 08/28/2017 0844   RBC 5.08 08/28/2017 0844   HGB 15.0 08/28/2017 0844   HCT 44.7 08/28/2017 0844   PLT 471.0 (H) 08/28/2017 0844   MCV 87.9 08/28/2017 0844   MCHC 33.7 08/28/2017 0844   RDW 14.3 08/28/2017 0844    Hgb A1C Lab Results  Component Value Date   HGBA1C 5.1 08/28/2017       Assessment & Plan:   Preventative Health Maintenance:  He declines flu shot He declines tetanus booster He declines pneumonia vaccines He declines shingrix He declines IFOB, Cologuard or colonoscopy Encouraged him to consume a balanced diet and exercise regimen Advised him to see an eye doctor and dentist annually Will  check CBC, CMET, Lipid, and PSA today  Decrease Hearing, Both Ears:  Referral to audiology placed per pt request  Fire Ant Bites, Left Ankle:  No intervention Should resolve on it's own  Chigger Bite of Right Ankle:  Benadryl 25 mg TID prn Monitor  RTC in 6 months to follow up chronic pain Nicki Reaperegina Amany Rando, NP

## 2019-05-11 NOTE — Patient Instructions (Signed)
Health Maintenance After Age 69 After age 69, you are at a higher risk for certain long-term diseases and infections as well as injuries from falls. Falls are a major cause of broken bones and head injuries in people who are older than age 69. Getting regular preventive care can help to keep you healthy and well. Preventive care includes getting regular testing and making lifestyle changes as recommended by your health care provider. Talk with your health care provider about:  Which screenings and tests you should have. A screening is a test that checks for a disease when you have no symptoms.  A diet and exercise plan that is right for you. What should I know about screenings and tests to prevent falls? Screening and testing are the best ways to find a health problem early. Early diagnosis and treatment give you the best chance of managing medical conditions that are common after age 69. Certain conditions and lifestyle choices may make you more likely to have a fall. Your health care provider may recommend:  Regular vision checks. Poor vision and conditions such as cataracts can make you more likely to have a fall. If you wear glasses, make sure to get your prescription updated if your vision changes.  Medicine review. Work with your health care provider to regularly review all of the medicines you are taking, including over-the-counter medicines. Ask your health care provider about any side effects that may make you more likely to have a fall. Tell your health care provider if any medicines that you take make you feel dizzy or sleepy.  Osteoporosis screening. Osteoporosis is a condition that causes the bones to get weaker. This can make the bones weak and cause them to break more easily.  Blood pressure screening. Blood pressure changes and medicines to control blood pressure can make you feel dizzy.  Strength and balance checks. Your health care provider may recommend certain tests to check your  strength and balance while standing, walking, or changing positions.  Foot health exam. Foot pain and numbness, as well as not wearing proper footwear, can make you more likely to have a fall.  Depression screening. You may be more likely to have a fall if you have a fear of falling, feel emotionally low, or feel unable to do activities that you used to do.  Alcohol use screening. Using too much alcohol can affect your balance and may make you more likely to have a fall. What actions can I take to lower my risk of falls? General instructions  Talk with your health care provider about your risks for falling. Tell your health care provider if: ? You fall. Be sure to tell your health care provider about all falls, even ones that seem minor. ? You feel dizzy, sleepy, or off-balance.  Take over-the-counter and prescription medicines only as told by your health care provider. These include any supplements.  Eat a healthy diet and maintain a healthy weight. A healthy diet includes low-fat dairy products, low-fat (lean) meats, and fiber from whole grains, beans, and lots of fruits and vegetables. Home safety  Remove any tripping hazards, such as rugs, cords, and clutter.  Install safety equipment such as grab bars in bathrooms and safety rails on stairs.  Keep rooms and walkways well-lit. Activity   Follow a regular exercise program to stay fit. This will help you maintain your balance. Ask your health care provider what types of exercise are appropriate for you.  If you need a cane or   walker, use it as recommended by your health care provider.  Wear supportive shoes that have nonskid soles. Lifestyle  Do not drink alcohol if your health care provider tells you not to drink.  If you drink alcohol, limit how much you have: ? 0-1 drink a day for women. ? 0-2 drinks a day for men.  Be aware of how much alcohol is in your drink. In the [place] one drink equals one typical bottle of beer (12  oz), one-half glass of wine (5 oz), or one shot of hard liquor (1 oz).  Do not use any products that contain nicotine or tobacco, such as cigarettes and e-cigarettes. If you need help quitting, ask your health care provider. Summary  Having a healthy lifestyle and getting preventive care can help to protect your health and wellness after age 69.  Screening and testing are the best way to find a health problem early and help you avoid having a fall. Early diagnosis and treatment give you the best chance for managing medical conditions that are more common for people who are older than age 69.  Falls are a major cause of broken bones and head injuries in people who are older than age 69. Take precautions to prevent a fall at home.  Work with your health care provider to learn what changes you can make to improve your health and wellness and to prevent falls. This information is not intended to replace advice given to you by your health care provider. Make sure you discuss any questions you have with your health care provider. Document Released: 08/13/2017 Document Revised: 01/21/2019 Document Reviewed: 08/13/2017 Elsevier Patient Education  2020 [employer]  

## 2019-05-12 NOTE — Progress Notes (Signed)
   Subjective:    Patient ID: Thomas Lamb, male    DOB: 12-20-1950, 68 y.o.   MRN: [id number]  HPI Reviewed and agree with current plan   Review of Systems     Objective:   Physical Exam         Assessment & Plan:

## 2019-05-14 ENCOUNTER — Encounter: Payer: Self-pay | Admitting: Internal Medicine

## 2019-06-03 DIAGNOSIS — H903 Sensorineural hearing loss, bilateral: Secondary | ICD-10-CM | POA: Diagnosis not present

## 2019-06-03 DIAGNOSIS — H9313 Tinnitus, bilateral: Secondary | ICD-10-CM | POA: Diagnosis not present

## 2019-06-08 ENCOUNTER — Encounter: Payer: Self-pay | Admitting: Internal Medicine

## 2019-06-08 ENCOUNTER — Other Ambulatory Visit: Payer: Self-pay

## 2019-06-08 ENCOUNTER — Ambulatory Visit (INDEPENDENT_AMBULATORY_CARE_PROVIDER_SITE_OTHER): Payer: BC Managed Care – PPO | Admitting: Internal Medicine

## 2019-06-08 VITALS — BP 122/80 | HR 50 | Temp 98.0°F | Wt 231.0 lb

## 2019-06-08 DIAGNOSIS — L255 Unspecified contact dermatitis due to plants, except food: Secondary | ICD-10-CM

## 2019-06-08 DIAGNOSIS — Z79899 Other long term (current) drug therapy: Secondary | ICD-10-CM

## 2019-06-08 MED ORDER — PREDNISONE 10 MG PO TABS: ORAL_TABLET | ORAL | 0 refills | Status: DC

## 2019-06-08 MED ORDER — METHYLPREDNISOLONE ACETATE 80 MG/ML IJ SUSP
80.0000 mg | Freq: Once | INTRAMUSCULAR | Status: AC
  Administered 2019-06-08: 80 mg via INTRAMUSCULAR

## 2019-06-08 NOTE — Addendum Note (Signed)
Addended by: Ellamae Sia on: 06/08/2019 11:36 AM   Modules accepted: Orders

## 2019-06-08 NOTE — Addendum Note (Signed)
Addended by: Lurlean Nanny on: 06/08/2019 11:17 AM   Modules accepted: Orders

## 2019-06-08 NOTE — Patient Instructions (Signed)
Poison Ivy Dermatitis Poison ivy dermatitis is redness and soreness of the skin caused by chemicals in the leaves of the poison ivy plant. You may have very bad itching, swelling, a rash, and blisters. What are the causes?  Touching a poison ivy plant.  Touching something that has the chemical on it. This may include animals or objects that have come in contact with the plant. What increases the risk?  Going outdoors often in wooded or marshy areas.  Going outdoors without wearing protective clothing, such as closed shoes, long pants, and a long-sleeved shirt. What are the signs or symptoms?   Skin redness.  Very bad itching.  A rash that often includes bumps and blisters. ? The rash usually appears 48 hours after exposure, if you have been exposed before. ? If this is the first time you have been exposed, the rash may not appear until a week after exposure.  Swelling. This may occur if the reaction is very bad. Symptoms usually last for 1-2 weeks. The first time you develop this condition, symptoms may last 3-4 weeks. How is this treated? This condition may be treated with:  Hydrocortisone cream or calamine lotion to relieve itching.  Oatmeal baths to soothe the skin.  Medicines, such as over-the-counter antihistamine tablets.  Oral steroid medicine for more severe reactions. Follow these instructions at home: Medicines  Take or apply over-the-counter and prescription medicines only as told by your doctor.  Use hydrocortisone cream or calamine lotion as needed to help with itching. General instructions  Do not scratch or rub your skin.  Put a cold, wet cloth (cold compress) on the affected areas or take baths in cool water. This will help with itching.  Avoid hot baths and showers.  Take oatmeal baths as needed. Use colloidal oatmeal. You can get this at a pharmacy or grocery store. Follow the instructions on the package.  While you have the rash, wash your clothes  right after you wear them.  Keep all follow-up visits as told by your health care provider. This is important. How is this prevented?   Know what poison ivy looks like, so you can avoid it. ? This plant has three leaves with flowering branches on a single stem. ? The leaves are glossy. ? The leaves have uneven edges that come to a point at the front.  If you touch poison ivy, wash your skin with soap and water right away. Be sure to wash under your fingernails.  When hiking or camping, wear long pants, a long-sleeved shirt, tall socks, and hiking boots. You can also use a lotion on your skin that helps to prevent contact with poison ivy.  If you think that your clothes or outdoor gear came in contact with poison ivy, rinse them off with a garden hose before you bring them inside your house.  When doing yard work or gardening, wear gloves, long sleeves, long pants, and boots. Wash your garden tools and gloves if they come in contact with poison ivy.  If you think that your pet has come into contact with poison ivy, wash him or her with pet shampoo and water. Make sure to wear gloves while washing your pet. Contact a doctor if:  You have open sores in the rash area.  You have more redness, swelling, or pain in the rash area.  You have redness that spreads beyond the rash area.  You have fluid, blood, or pus coming from the rash area.  You have a   fever.  You have a rash over a large area of your body.  You have a rash on your eyes, mouth, or genitals.  Your rash does not get better after a few weeks. Get help right away if:  Your face swells or your eyes swell shut.  You have trouble breathing.  You have trouble swallowing. These symptoms may be an emergency. Do not wait to see if the symptoms will go away. Get medical help right away. Call your local emergency services (911 in the [place] Do not drive yourself to the hospital. Summary  Poison ivy dermatitis is redness and  soreness of the skin caused by chemicals in the leaves of the poison ivy plant.  You may have skin redness, very bad itching, swelling, and a rash.  Do not scratch or rub your skin.  Take or apply over-the-counter and prescription medicines only as told by your doctor. This information is not intended to replace advice given to you by your health care provider. Make sure you discuss any questions you have with your health care provider. Document Released: 11/02/2010 Document Revised: 01/22/2019 Document Reviewed: 09/25/2018 Elsevier Patient Education  2020 Elsevier Inc.  

## 2019-06-08 NOTE — Progress Notes (Signed)
Subjective:    Patient ID: Gracelyn NurseWilliam Van Zile, male    DOB: 1951/04/12, 68 y.o.   MRN: [id number]  HPI  Pt presents to the clinic today with c/o rash. He first noticed this 3 weeks ago. The rash is located on bilateral lower legs and back of his right thigh. The rash does itch. He has tried Financial risk analystChigger Rid, ProofreaderCaladryl with minimal relief. His wife was recently treated for poison ivy.  Review of Systems      Past Medical History:  Diagnosis Date  . Chicken pox     Current Outpatient Medications  Medication Sig Dispense Refill  . Glucosa-Chondr-Na Chondr-MSM [phone] MG TABS Take 1 tablet by mouth daily.    Marland Kitchen. HYDROcodone-acetaminophen (NORCO) 10-325 MG tablet Take 1 tablet by mouth daily as needed. 30 tablet 0  . Multiple Vitamin (MULTIVITAMIN) tablet Take 1 tablet by mouth daily.     No current facility-administered medications for this visit.     Allergies  Allergen Reactions  . Penicillins Hives    Family History  Problem Relation Age of Onset  . Arthritis Mother   . Arthritis Father   . Stroke Father   . Hypertension Father   . Cancer Neg Hx   . Diabetes Neg Hx   . Early death Neg Hx     Social History   Socioeconomic History  . Marital status: Married    Spouse name: Not on file  . Number of children: Not on file  . Years of education: Not on file  . Highest education level: Not on file  Occupational History  . Not on file  Social Needs  . Financial resource strain: Not on file  . Food insecurity    Worry: Not on file    Inability: Not on file  . Transportation needs    Medical: Not on file    Non-medical: Not on file  Tobacco Use  . Smoking status: Former Smoker    Types: Cigarettes    Quit date: 10/14/2002    Years since quitting: 16.6  . Smokeless tobacco: Never Used  Substance and Sexual Activity  . Alcohol use: Yes    Alcohol/week: 2.0 standard drinks    Types: 2 Cans of beer per week    Comment: occasional  . Drug use: No  . Sexual  activity: Not on file  Lifestyle  . Physical activity    Days per week: Not on file    Minutes per session: Not on file  . Stress: Not on file  Relationships  . Social Musicianconnections    Talks on phone: Not on file    Gets together: Not on file    Attends religious service: Not on file    Active member of club or organization: Not on file    Attends meetings of clubs or organizations: Not on file    Relationship status: Not on file  . Intimate partner violence    Fear of current or ex partner: Not on file    Emotionally abused: Not on file    Physically abused: Not on file    Forced sexual activity: Not on file  Other Topics Concern  . Not on file  Social History Narrative  . Not on file     Constitutional: Denies fever, malaise, fatigue, headache or abrupt weight changes.  Skin: Pt reports rash. Denies ulcercations.   No other specific complaints in a complete review of systems (except as listed in HPI above).  Objective:  Physical Exam   BP 122/80   Pulse (!) 50   Temp 98 F (36.7 C) (Temporal)   Wt 231 lb (104.8 kg)   SpO2 97%   BMI 31.77 kg/m  Wt Readings from Last 3 Encounters:  06/08/19 231 lb (104.8 kg)  05/11/19 229 lb (103.9 kg)  02/04/19 230 lb (104.3 kg)    General: Appears his stated age, well developed, well nourished in NAD. Skin: Scattered scabbed papular lesions of BLE. Grouped vesicular lesions on erythematous base noted back of right thigh. Cardiovascular: Bradycardic with normal rhythm. Pulmonary/Chest: Normal effort and positive vesicular breath sounds. No respiratory distress. No wheezes, rales or ronchi noted.  Neurological: Alert and oriented.   BMET    Component Value Date/Time   NA 140 05/11/2019 1101   K 4.6 05/11/2019 1101   CL 104 05/11/2019 1101   CO2 30 05/11/2019 1101   GLUCOSE 101 (H) 05/11/2019 1101   BUN 21 05/11/2019 1101   CREATININE 1.21 05/11/2019 1101   CALCIUM 9.7 05/11/2019 1101    Lipid Panel     Component  Value Date/Time   CHOL 163 05/11/2019 1101   TRIG 144.0 05/11/2019 1101   HDL 39.30 05/11/2019 1101   CHOLHDL 4 05/11/2019 1101   VLDL 28.8 05/11/2019 1101   LDLCALC 95 05/11/2019 1101    CBC    Component Value Date/Time   WBC 6.7 05/11/2019 1101   RBC 4.78 05/11/2019 1101   HGB 14.5 05/11/2019 1101   HCT 43.4 05/11/2019 1101   PLT 463.0 (H) 05/11/2019 1101   MCV 90.7 05/11/2019 1101   MCHC 33.4 05/11/2019 1101   RDW 14.4 05/11/2019 1101    Hgb A1C Lab Results  Component Value Date   HGBA1C 5.1 08/28/2017           Assessment & Plan:   Contact Dermatitis due to Plant:  80 mg Depo IM today RX for Pred Taper x 9 days Ok to continue [place], Benadryl OTC  Return precautions discussed Webb Silversmith, NP

## 2019-06-10 LAB — PAIN MGMT, PROFILE 8 W/CONF, U
6 Acetylmorphine: NEGATIVE ng/mL
Alcohol Metabolites: NEGATIVE ng/mL (ref <500)
Amphetamines: NEGATIVE ng/mL
Benzodiazepines: NEGATIVE ng/mL
Buprenorphine, Urine: NEGATIVE ng/mL
Cocaine Metabolite: NEGATIVE ng/mL
Codeine: NEGATIVE ng/mL
Creatinine: 100.9 mg/dL
Hydrocodone: 796 ng/mL
Hydromorphone: 242 ng/mL
MDMA: NEGATIVE ng/mL
Marijuana Metabolite: NEGATIVE ng/mL
Morphine: 98 ng/mL
Norhydrocodone: 510 ng/mL
Opiates: POSITIVE ng/mL
Oxidant: NEGATIVE ug/mL
Oxycodone: NEGATIVE ng/mL
pH: 5.5 (ref 4.5–9.0)

## 2019-07-19 ENCOUNTER — Other Ambulatory Visit: Payer: Self-pay | Admitting: Internal Medicine

## 2019-07-19 DIAGNOSIS — Z79899 Other long term (current) drug therapy: Secondary | ICD-10-CM

## 2019-07-19 MED ORDER — HYDROCODONE-ACETAMINOPHEN 10-325 MG PO TABS: 1.0000 | ORAL_TABLET | Freq: Every day | ORAL | 0 refills | Status: DC | PRN

## 2019-07-19 NOTE — Telephone Encounter (Signed)
Name of Medication: Hydrocodone apap 10-325 mg [facility] Date and Quantity: # 30 on 05/11/19 Last Office Visit and Type: 06/08/19 acute &05/11/19 MWV Next Office Visit and Type: no future appt scheduled. Last Controlled Substance Agreement Date: 05/13/19 Last UDS:06/08/19

## 2019-07-19 NOTE — Telephone Encounter (Signed)
Patient called and is requesting a refill on Hydrocodone.  [facility] uses [facility].

## 2019-09-06 ENCOUNTER — Other Ambulatory Visit: Payer: Self-pay | Admitting: Internal Medicine

## 2019-09-06 DIAGNOSIS — Z79899 Other long term (current) drug therapy: Secondary | ICD-10-CM

## 2019-09-06 NOTE — Telephone Encounter (Signed)
Patient is requesting a refill  HYDROCODONE   Patient has enough for tomorrow   [facility]

## 2019-09-06 NOTE — Telephone Encounter (Signed)
Name of Medication: hydrocodone apap 10-325 mg Name of Pharmacy: [place] or Written Date and Quantity: # 30 on 07/19/19 Last Office Visit and Type: acute on 06/08/19 and  [place] on 05/11/19 Next Office Visit and Type: none scheduled Last Controlled Substance Agreement Date: 05/13/19 Last UDS:06/08/19

## 2019-09-07 MED ORDER — HYDROCODONE-ACETAMINOPHEN 10-325 MG PO TABS: 1.0000 | ORAL_TABLET | Freq: Every day | ORAL | 0 refills | Status: DC | PRN

## 2019-10-21 ENCOUNTER — Other Ambulatory Visit: Payer: Self-pay | Admitting: Internal Medicine

## 2019-10-21 DIAGNOSIS — Z79899 Other long term (current) drug therapy: Secondary | ICD-10-CM

## 2019-10-21 NOTE — Telephone Encounter (Signed)
Name of Medication: Hydrocodone apap 10-325 mg Name of [facility] [place] or Written Date and Quantity: # 30 on 09/07/19 Last Office Visit and Type: 05/11/19 med wellness Next Office Visit and Type:none scheduled Last Controlled Substance Agreement Date: 05/13/19 Last UDS:06/08/19

## 2019-10-21 NOTE — Telephone Encounter (Signed)
Patient called to get a refill on Hydrocodone.  Patient will be out of medication by the end of the weekend.  [facility] uses [facility].

## 2019-10-22 MED ORDER — HYDROCODONE-ACETAMINOPHEN 10-325 MG PO TABS: 1.0000 | ORAL_TABLET | Freq: Every day | ORAL | 0 refills | Status: DC | PRN

## 2019-12-22 ENCOUNTER — Other Ambulatory Visit: Payer: Self-pay | Admitting: *Deleted

## 2019-12-22 DIAGNOSIS — Z79899 Other long term (current) drug therapy: Secondary | ICD-10-CM

## 2019-12-22 MED ORDER — HYDROCODONE-ACETAMINOPHEN 10-325 MG PO TABS: 1.0000 | ORAL_TABLET | Freq: Every day | ORAL | 0 refills | Status: DC | PRN

## 2019-12-22 NOTE — Telephone Encounter (Signed)
Patient left a voicemail requesting a refill on Hydrocodone Name of Medication: Hydrocodone [facility] Date and Quantity: 10/22/19 #30 Last Office Visit and Type: 06/08/19 Next Office Visit and Type: none scheduled Last Controlled Substance Agreement Date: 05/13/19 Last UDS: 06/08/19

## 2020-02-01 DIAGNOSIS — H25819 Combined forms of age-related cataract, unspecified eye: Secondary | ICD-10-CM | POA: Diagnosis not present

## 2020-02-09 ENCOUNTER — Other Ambulatory Visit: Payer: Self-pay

## 2020-02-09 ENCOUNTER — Telehealth: Payer: Self-pay | Admitting: Internal Medicine

## 2020-02-09 DIAGNOSIS — Z79899 Other long term (current) drug therapy: Secondary | ICD-10-CM

## 2020-02-09 MED ORDER — HYDROCODONE-ACETAMINOPHEN 10-325 MG PO TABS: 1.0000 | ORAL_TABLET | Freq: Every day | ORAL | 0 refills | Status: DC | PRN

## 2020-02-09 NOTE — Telephone Encounter (Signed)
Pt left a VM requesting a refill of his Hydrocodone  [id number] [place], [place] - [address] [phone]

## 2020-02-09 NOTE — Telephone Encounter (Signed)
Last filled 12/22/2019... please advise

## 2020-03-28 ENCOUNTER — Other Ambulatory Visit: Payer: Self-pay | Admitting: *Deleted

## 2020-03-28 DIAGNOSIS — Z79899 Other long term (current) drug therapy: Secondary | ICD-10-CM

## 2020-03-28 MED ORDER — HYDROCODONE-ACETAMINOPHEN 10-325 MG PO TABS: 1.0000 | ORAL_TABLET | Freq: Every day | ORAL | 0 refills | Status: DC | PRN

## 2020-03-28 NOTE — Telephone Encounter (Signed)
Patient left a voicemail requesting a refill on Hydrocodone Last refill 02/09/20 #30 Last office visit 06/08/19 No upcoming appointment scheduled

## 2020-03-28 NOTE — Telephone Encounter (Signed)
Needs to schedule chronic pain management follow up

## 2020-04-01 NOTE — Telephone Encounter (Signed)
Pt is due for CPE late July--okay to wait?  Mailed letter letting pt know he needs to schedule

## 2020-04-02 NOTE — Telephone Encounter (Cosign Needed)
yes

## 2020-04-20 ENCOUNTER — Telehealth: Payer: Self-pay

## 2020-04-20 NOTE — Telephone Encounter (Signed)
CPE scheduled for 06/15/2020

## 2020-05-23 ENCOUNTER — Other Ambulatory Visit: Payer: Self-pay | Admitting: *Deleted

## 2020-05-23 DIAGNOSIS — Z79899 Other long term (current) drug therapy: Secondary | ICD-10-CM

## 2020-05-23 MED ORDER — HYDROCODONE-ACETAMINOPHEN 10-325 MG PO TABS: 1.0000 | ORAL_TABLET | Freq: Every day | ORAL | 0 refills | Status: DC | PRN

## 2020-05-23 NOTE — Telephone Encounter (Signed)
Patient left a voicemail stating that he wants to change his pharmacy to [facility].[address], telephone number [phone]. Patient stated that he would like for a refill on his Hydrocodone be sent to his new pharmacy.

## 2020-05-23 NOTE — Telephone Encounter (Signed)
Last filled 03/28/2020... CPE scheduled for 06/15/2020...Marland Kitchen please advise

## 2020-05-30 ENCOUNTER — Telehealth: Payer: Self-pay | Admitting: *Deleted

## 2020-05-30 NOTE — Telephone Encounter (Signed)
I called the pharmacy to verify information needed to complete prior auth... Attempted to complete PA via [email or web address] for [employer]... pt would not verify under eligibility... I then called [employer] and was advised that they do NOT handle prescription PA and they will need to be sent to [facility] [place] directly.... PA submitted via [email or web address] for [employer] [place] and Rx has been approved... pt is aware as instructed and will call the pharmacy to have them process the Rx again

## 2020-05-30 NOTE — Telephone Encounter (Signed)
Patient left a voicemail stating that he has been trying to get a refill on his Hydrocodone for over a week. Patient stated that it has been a comedy of errors. Patient stated that he is completely now. Patient request a call back about this.

## 2020-06-15 ENCOUNTER — Other Ambulatory Visit: Payer: Self-pay

## 2020-06-15 ENCOUNTER — Encounter: Payer: Self-pay | Admitting: Internal Medicine

## 2020-06-15 ENCOUNTER — Ambulatory Visit (INDEPENDENT_AMBULATORY_CARE_PROVIDER_SITE_OTHER): Payer: BC Managed Care – PPO | Admitting: Internal Medicine

## 2020-06-15 VITALS — BP 124/82 | HR 55 | Temp 97.7°F | Ht 71.5 in | Wt 239.0 lb

## 2020-06-15 DIAGNOSIS — M545 Low back pain: Secondary | ICD-10-CM

## 2020-06-15 DIAGNOSIS — M25512 Pain in left shoulder: Secondary | ICD-10-CM

## 2020-06-15 DIAGNOSIS — G8929 Other chronic pain: Secondary | ICD-10-CM

## 2020-06-15 DIAGNOSIS — M25511 Pain in right shoulder: Secondary | ICD-10-CM

## 2020-06-15 DIAGNOSIS — Z79899 Other long term (current) drug therapy: Secondary | ICD-10-CM | POA: Diagnosis not present

## 2020-06-15 DIAGNOSIS — Z0000 Encounter for general adult medical examination without abnormal findings: Secondary | ICD-10-CM | POA: Diagnosis not present

## 2020-06-15 DIAGNOSIS — Z125 Encounter for screening for malignant neoplasm of prostate: Secondary | ICD-10-CM

## 2020-06-15 LAB — CBC
HCT: 42.4 % (ref 39.0–52.0)
Hemoglobin: 14.7 g/dL (ref 13.0–17.0)
MCHC: 34.7 g/dL (ref 30.0–36.0)
MCV: 88.2 fl (ref 78.0–100.0)
Platelets: 433 10*3/uL — ABNORMAL HIGH (ref 150.0–400.0)
RBC: 4.8 Mil/uL (ref 4.22–5.81)
RDW: 14 % (ref 11.5–15.5)
WBC: 7.2 10*3/uL (ref 4.0–10.5)

## 2020-06-15 LAB — LIPID PANEL
Cholesterol: 160 mg/dL (ref 0–200)
HDL: 33.5 mg/dL — ABNORMAL LOW (ref >39.00)
LDL Cholesterol: 86 mg/dL (ref 0–99)
NonHDL: 126.22
Total CHOL/HDL Ratio: 5
Triglycerides: 199 mg/dL — ABNORMAL HIGH (ref 0.0–149.0)
VLDL: 39.8 mg/dL (ref 0.0–40.0)

## 2020-06-15 LAB — COMPREHENSIVE METABOLIC PANEL
ALT: 27 U/L (ref 0–53)
AST: 21 U/L (ref 0–37)
Albumin: 4.7 g/dL (ref 3.5–5.2)
Alkaline Phosphatase: 51 U/L (ref 39–117)
BUN: 23 mg/dL (ref 6–23)
CO2: 32 mEq/L (ref 19–32)
Calcium: 9.9 mg/dL (ref 8.4–10.5)
Chloride: 102 mEq/L (ref 96–112)
Creatinine, Ser: 1.25 mg/dL (ref 0.40–1.50)
GFR: 57.09 mL/min — ABNORMAL LOW (ref >60.00)
Glucose, Bld: 92 mg/dL (ref 70–99)
Potassium: 4.6 mEq/L (ref 3.5–5.1)
Sodium: 140 mEq/L (ref 135–145)
Total Bilirubin: 0.7 mg/dL (ref 0.2–1.2)
Total Protein: 7 g/dL (ref 6.0–8.3)

## 2020-06-15 LAB — PSA: PSA: 0.52 ng/mL (ref 0.10–4.00)

## 2020-06-15 NOTE — Progress Notes (Signed)
   Subjective:    Patient ID: Thomas Lamb, male    DOB: Oct 25, 1950, 69 y.o.   MRN: [id number]  HPI I reviewed nurse practitioner's note, was available for consultation, and agree with documentation and plan.    Review of Systems     Objective:   Physical Exam         Assessment & Plan:

## 2020-06-15 NOTE — Assessment & Plan Note (Signed)
Stable on Hydrocodone Encouraged daily stretching CSA and UDS today

## 2020-06-15 NOTE — Assessment & Plan Note (Signed)
Stable on Hydrocodone °Encouraged daily stretching °CSA and UDS today °

## 2020-06-15 NOTE — Progress Notes (Signed)
HPI:  Patient presents the clinic today for his subsequent annual Medicare wellness exam.  He is also due to follow-up chronic pain:  He has a history of chronic left shoulder and chronic back pain.  X-ray of the lumbar spine showed:  IMPRESSION: 1. Severe acromioclavicular arthropathy with subchondral edema, cysts and possible erosions in the distal clavicle and acromion. These findings likely contribute to superior shoulder pain. 2. Mild diffuse rotator cuff tendinosis. No evidence of rotator cuff tear. 3. No evidence of labral or biceps tendon pathology.   Xray of the left shoulder showed:   IMPRESSION: Mild AC joint osteoarthritis.   He has had 2 prior surgeries on his lower back.   Indication for chronic opioid: Chronic pain of both shoulders/chronic low back pain. Medication and dose: Hydrocodone 10-325 mg tab, 1 tab PO daily prn # pills per month: 30 Last UDS date: 05/2019 Opioid Treatment Agreement signed (Y/N): yes Opioid Treatment Agreement last reviewed with patient:  06/15/2020 [facility] reviewed this encounter (include red flags):  06/15/2020, no red flags   Flu: never Tetanus: < 10 years ago Pneumovax: never Prevnar: never Covid: Pfizer Zostavax: never Shingrix: never PSA: 04/2019 Colon Screening: never Eye Doctor: annually Dental Exam: annually  Diet: He does eat meat. He consumes fruits and veggies daily. He does eat some fried foods. He drinks mostly Coke Zero Exercise: None  Past Medical History:  Diagnosis Date  . Chicken pox     Current Outpatient Medications  Medication Sig Dispense Refill  . Glucosa-Chondr-Na Chondr-MSM [phone] MG TABS Take 1 tablet by mouth daily.    Marland Kitchen HYDROcodone-acetaminophen (NORCO) 10-325 MG tablet Take 1 tablet by mouth daily as needed. 30 tablet 0  . Multiple Vitamin (MULTIVITAMIN) tablet Take 1 tablet by mouth daily.    . predniSONE (DELTASONE) 10 MG tablet Take 3 tabs on days 1-3, take 2 tabs on days 4-6, take 1 tab on  days 7-9 18 tablet 0   No current facility-administered medications for this visit.    Allergies  Allergen Reactions  . Penicillins Hives    Family History  Problem Relation Age of Onset  . Arthritis Mother   . Arthritis Father   . Stroke Father   . Hypertension Father   . Cancer Neg Hx   . Diabetes Neg Hx   . Early death Neg Hx     Social History   Socioeconomic History  . Marital status: Married    Spouse name: Not on file  . Number of children: Not on file  . Years of education: Not on file  . Highest education level: Not on file  Occupational History  . Not on file  Tobacco Use  . Smoking status: Former Smoker    Types: Cigarettes    Quit date: 10/14/2002    Years since quitting: 17.6  . Smokeless tobacco: Never Used  Vaping Use  . Vaping Use: Never used  Substance and Sexual Activity  . Alcohol use: Yes    Alcohol/week: 2.0 standard drinks    Types: 2 Cans of beer per week    Comment: occasional  . Drug use: No  . Sexual activity: Not on file  Other Topics Concern  . Not on file  Social History Narrative  . Not on file   Social Determinants of Health   Financial Resource Strain:   . Difficulty of Paying Living Expenses: Not on file  Food Insecurity:   . Worried About Charity fundraiser in the Last Year:  Not on file  . Ran Out of Food in the Last Year: Not on file  Transportation Needs:   . Lack of Transportation (Medical): Not on file  . Lack of Transportation (Non-Medical): Not on file  Physical Activity:   . Days of Exercise per Week: Not on file  . Minutes of Exercise per Session: Not on file  Stress:   . Feeling of Stress : Not on file  Social Connections:   . Frequency of Communication with Friends and Family: Not on file  . Frequency of Social Gatherings with Friends and Family: Not on file  . Attends Religious Services: Not on file  . Active Member of Clubs or Organizations: Not on file  . Attends Archivist Meetings: Not on  file  . Marital Status: Not on file  Intimate Partner Violence:   . Fear of Current or Ex-Partner: Not on file  . Emotionally Abused: Not on file  . Physically Abused: Not on file  . Sexually Abused: Not on file    Subjective:   Review of Systems:   Constitutional: Denies fever, malaise, fatigue, headache or abrupt weight changes.  HEENT: Denies eye pain, eye redness, ear pain, ringing in the ears, wax buildup, runny nose, nasal congestion, bloody nose, or sore throat. Respiratory: Denies difficulty breathing, shortness of breath, cough or sputum production.   Cardiovascular: Denies chest pain, chest tightness, palpitations or swelling in the hands or feet.  Gastrointestinal: Denies abdominal pain, bloating, constipation, diarrhea or blood in the stool.  GU: Denies urgency, frequency, pain with urination, burning sensation, blood in urine, odor or discharge. Musculoskeletal: Patient reports chronic shoulder and back pain, right ankle instability.  Denies decrease in range of motion, difficulty with gait, muscle pain or joint swelling.  Skin: Denies redness, rashes, lesions or ulcercations.  Neurological: Denies dizziness, difficulty with memory, difficulty with speech or problems with balance and coordination.  Psych: Denies anxiety, depression, SI/HI.  No other specific complaints in a complete review of systems (except as listed in HPI above).  Objective:  PE:   BP 124/82   Pulse (!) 55   Temp 97.7 F (36.5 C) (Temporal)   Ht 5' 11.5" (1.816 m)   Wt 239 lb (108.4 kg)   SpO2 98%   BMI 32.87 kg/m   Wt Readings from Last 3 Encounters:  06/08/19 231 lb (104.8 kg)  05/11/19 229 lb (103.9 kg)  02/04/19 230 lb (104.3 kg)    General: Appears his stated age, obese, in NAD. Skin: Warm, dry and intact. No rashes noted. HEENT: Head: normal shape and size; Eyes: sclera white, no icterus, conjunctiva pink, PERRLA and EOMs intact;  Neck: Neck supple, trachea midline. No masses,  lumps or thyromegaly present.  Cardiovascular: Normal rate and rhythm. S1,S2 noted.  No murmur, rubs or gallops noted. No JVD or BLE edema. No carotid bruits noted. Pulmonary/Chest: Normal effort and positive vesicular breath sounds. No respiratory distress. No wheezes, rales or ronchi noted.  Abdomen: Soft and nontender. Normal bowel sounds. No distention or masses noted. Liver, spleen and kidneys non palpable. Musculoskeletal: Pain with palpation over the lumbar spine. Strength 5/5 BUE/BLE. No signs of joint swelling.  Neurological: Alert and oriented. Cranial nerves II-XII grossly intact. Coordination normal.  Psychiatric: Mood and affect normal. Behavior is normal. Judgment and thought content normal.     BMET    Component Value Date/Time   NA 140 05/11/2019 1101   K 4.6 05/11/2019 1101   CL 104 05/11/2019 1101  CO2 30 05/11/2019 1101   GLUCOSE 101 (H) 05/11/2019 1101   BUN 21 05/11/2019 1101   CREATININE 1.21 05/11/2019 1101   CALCIUM 9.7 05/11/2019 1101    Lipid Panel     Component Value Date/Time   CHOL 163 05/11/2019 1101   TRIG 144.0 05/11/2019 1101   HDL 39.30 05/11/2019 1101   CHOLHDL 4 05/11/2019 1101   VLDL 28.8 05/11/2019 1101   LDLCALC 95 05/11/2019 1101    CBC    Component Value Date/Time   WBC 6.7 05/11/2019 1101   RBC 4.78 05/11/2019 1101   HGB 14.5 05/11/2019 1101   HCT 43.4 05/11/2019 1101   PLT 463.0 (H) 05/11/2019 1101   MCV 90.7 05/11/2019 1101   MCHC 33.4 05/11/2019 1101   RDW 14.4 05/11/2019 1101    Hgb A1C Lab Results  Component Value Date   HGBA1C 5.1 08/28/2017     Assessment and Plan:  Preventative Health Maintenance:  He declines flu shot today He reports his tetanus is UTD but this cannot be verified He declines Pneumovax, Prevnar, Covid vaccine and Shingrix He declines Ifob, Cologuard or colonoscopy at this time Encouraged him to consume a balanced diet and exercise regimen Advised him to see an eye doctor and dentist  annually We will check CBC, C met, lipid, and PSA today  RTC in 1 year, sooner if needed   Webb Silversmith, NP This visit occurred during the SARS-CoV-2 public health emergency.  Safety protocols were in place, including screening questions prior to the visit, additional usage of staff PPE, and extensive cleaning of exam room while observing appropriate contact time as indicated for disinfecting solutions.

## 2020-06-17 LAB — DRUG MONITORING, PANEL 8 WITH CONFIRMATION, URINE
6 Acetylmorphine: NEGATIVE ng/mL (ref <10)
Alcohol Metabolites: NEGATIVE ng/mL
Amphetamines: NEGATIVE ng/mL (ref <500)
Benzodiazepines: NEGATIVE ng/mL (ref <100)
Buprenorphine, Urine: NEGATIVE ng/mL (ref <5)
Cocaine Metabolite: NEGATIVE ng/mL (ref <150)
Codeine: NEGATIVE ng/mL (ref <50)
Creatinine: 109.5 mg/dL
Hydrocodone: 494 ng/mL — ABNORMAL HIGH (ref <50)
Hydromorphone: 181 ng/mL — ABNORMAL HIGH (ref <50)
MDMA: NEGATIVE ng/mL (ref <500)
Marijuana Metabolite: NEGATIVE ng/mL (ref <20)
Morphine: NEGATIVE ng/mL (ref <50)
Norhydrocodone: 425 ng/mL — ABNORMAL HIGH (ref <50)
Opiates: POSITIVE ng/mL — AB (ref <100)
Oxidant: NEGATIVE ug/mL
Oxycodone: NEGATIVE ng/mL (ref <100)
pH: 5.5 (ref 4.5–9.0)

## 2020-06-17 LAB — DM TEMPLATE

## 2020-07-04 ENCOUNTER — Other Ambulatory Visit: Payer: Self-pay

## 2020-07-04 DIAGNOSIS — Z79899 Other long term (current) drug therapy: Secondary | ICD-10-CM

## 2020-07-04 MED ORDER — HYDROCODONE-ACETAMINOPHEN 10-325 MG PO TABS: 1.0000 | ORAL_TABLET | Freq: Every day | ORAL | 0 refills | Status: DC | PRN

## 2020-07-04 NOTE — Telephone Encounter (Signed)
Name of Medication: Hydrocodone apap 10-325 mg Name of [facility]: [facility] Last Fill or Written Date and Quantity: # 30 on 05/23/20 Last Office Visit and Type: 06/15/2020 annual exam Next Office Visit and Type: none scheduled Last Controlled Substance Agreement Date: 06/20/20 Last UDS:06/15/20

## 2020-07-11 NOTE — Telephone Encounter (Signed)
Patient called stating that he requested a refill last week and never heard anything back. Advised patient that prescription was sent to the pharmacy on 07/04/20. Patient stated that he has not checked with the pharmacy and will contact them.

## 2020-08-28 ENCOUNTER — Other Ambulatory Visit: Payer: Self-pay

## 2020-08-28 DIAGNOSIS — Z79899 Other long term (current) drug therapy: Secondary | ICD-10-CM

## 2020-08-28 MED ORDER — HYDROCODONE-ACETAMINOPHEN 10-325 MG PO TABS: 1.0000 | ORAL_TABLET | Freq: Every day | ORAL | 0 refills | Status: DC | PRN

## 2020-08-28 NOTE — Telephone Encounter (Signed)
Name of Medication: Hydrocodone apap 10-325 mg Name of Pharmacy: [facility] Last Fill or Written Date and Quantity: # 30 on 07/04/20 Last Office Visit and Type: 06/15/20 annual Next Office Visit and Type: none scheduled Last Controlled Substance Agreement Date: 06/20/20 Last UDS:06/15/2020  In v/m pt noted was almost out of medication.

## 2020-08-28 NOTE — Telephone Encounter (Signed)
Pt notified as instructed that hydrocodone apap 10-325 mg was sent electronically to [facility]. Pt voiced understanding and appreciative.

## 2020-10-17 ENCOUNTER — Other Ambulatory Visit: Payer: Self-pay

## 2020-10-17 DIAGNOSIS — Z79899 Other long term (current) drug therapy: Secondary | ICD-10-CM

## 2020-10-17 NOTE — Telephone Encounter (Signed)
Name of Medication: Hydrocodone apap 10-325 mg Name of Pharmacy: [facility] Last Fill or Written Date and Quantity: # 30 on 08/28/20 Last Office Visit and Type: 06/15/2020 annual Next Office Visit and Type: no future appt scheduled Last Controlled Substance Agreement Date:06/20/20  Last UDS:06/15/20

## 2020-10-18 MED ORDER — HYDROCODONE-ACETAMINOPHEN 10-325 MG PO TABS: 1.0000 | ORAL_TABLET | Freq: Every day | ORAL | 0 refills | Status: DC | PRN

## 2020-12-11 ENCOUNTER — Other Ambulatory Visit: Payer: Self-pay | Admitting: *Deleted

## 2020-12-11 DIAGNOSIS — Z79899 Other long term (current) drug therapy: Secondary | ICD-10-CM

## 2020-12-11 MED ORDER — HYDROCODONE-ACETAMINOPHEN 10-325 MG PO TABS
1.0000 | ORAL_TABLET | Freq: Every day | ORAL | 0 refills | Status: DC | PRN
Start: 2020-12-11 — End: 2021-01-31

## 2020-12-11 NOTE — Telephone Encounter (Signed)
Patient called requesting a refill Name of Medication: Hydrocodone Name of Pharmacy: [facility] Fill or Written Date and Quantity: 10/18/20 Last Office Visit and Type:06/15/20 Next Office Visit and Type: none scheduled Last Controlled Substance Agreement Date: 06/20/20 Last USD 06/15/20

## 2021-01-31 ENCOUNTER — Other Ambulatory Visit: Payer: Self-pay

## 2021-01-31 DIAGNOSIS — Z79899 Other long term (current) drug therapy: Secondary | ICD-10-CM

## 2021-01-31 NOTE — Telephone Encounter (Signed)
Last filled 12/11/2020, pt states he will be moving with you to [place] last CPE 06/2020...Marland Kitchen please advise

## 2021-02-01 MED ORDER — HYDROCODONE-ACETAMINOPHEN 10-325 MG PO TABS: 1.0000 | ORAL_TABLET | Freq: Every day | ORAL | 0 refills | Status: DC | PRN

## 2021-04-09 ENCOUNTER — Telehealth: Payer: Self-pay | Admitting: Internal Medicine

## 2021-04-09 DIAGNOSIS — Z79899 Other long term (current) drug therapy: Secondary | ICD-10-CM

## 2021-04-09 NOTE — Telephone Encounter (Signed)
Medication Refill - Medication: hydrocodone  Has the patient contacted their pharmacy? Yes.   Told to call provider Preferred Pharmacy (with phone number or street name): [facility]  in [place] [address] Carollee Herter  Agent: Please be advised that RX refills may take up to 3 business days. We ask that you follow-up with your pharmacy.

## 2021-04-09 NOTE — Telephone Encounter (Signed)
This refill cannot be delegated

## 2021-04-17 MED ORDER — HYDROCODONE-ACETAMINOPHEN 10-325 MG PO TABS
1.0000 | ORAL_TABLET | Freq: Every day | ORAL | 0 refills | Status: DC | PRN
Start: 2021-04-17 — End: 2021-06-04

## 2021-04-17 NOTE — Addendum Note (Signed)
Addended by: Lorre Munroe on: 04/17/2021 02:52 PM   Modules accepted: Orders

## 2021-04-17 NOTE — Telephone Encounter (Signed)
Pt calling in regarding this medication. Pt scheduled an appt for 04/24/21 and is requesting to have a refill until then. Please advise.

## 2021-04-18 DIAGNOSIS — H524 Presbyopia: Secondary | ICD-10-CM | POA: Diagnosis not present

## 2021-04-18 DIAGNOSIS — H40032 Anatomical narrow angle, left eye: Secondary | ICD-10-CM | POA: Diagnosis not present

## 2021-04-24 ENCOUNTER — Ambulatory Visit: Payer: BC Managed Care – PPO | Admitting: Internal Medicine

## 2021-04-24 ENCOUNTER — Other Ambulatory Visit: Payer: Self-pay

## 2021-04-24 ENCOUNTER — Encounter: Payer: Self-pay | Admitting: Internal Medicine

## 2021-04-24 VITALS — BP 128/66 | HR 57 | Temp 97.5°F | Resp 17 | Ht 71.5 in | Wt 241.4 lb

## 2021-04-24 DIAGNOSIS — M25512 Pain in left shoulder: Secondary | ICD-10-CM

## 2021-04-24 DIAGNOSIS — E6609 Other obesity due to excess calories: Secondary | ICD-10-CM | POA: Insufficient documentation

## 2021-04-24 DIAGNOSIS — N1831 Chronic kidney disease, stage 3a: Secondary | ICD-10-CM | POA: Diagnosis not present

## 2021-04-24 DIAGNOSIS — M545 Low back pain, unspecified: Secondary | ICD-10-CM

## 2021-04-24 DIAGNOSIS — E781 Pure hyperglyceridemia: Secondary | ICD-10-CM | POA: Insufficient documentation

## 2021-04-24 DIAGNOSIS — M25511 Pain in right shoulder: Secondary | ICD-10-CM

## 2021-04-24 DIAGNOSIS — G8929 Other chronic pain: Secondary | ICD-10-CM

## 2021-04-24 DIAGNOSIS — D75839 Thrombocytosis, unspecified: Secondary | ICD-10-CM

## 2021-04-24 DIAGNOSIS — Z6833 Body mass index (BMI) 33.0-33.9, adult: Secondary | ICD-10-CM

## 2021-04-24 DIAGNOSIS — N183 Chronic kidney disease, stage 3 unspecified: Secondary | ICD-10-CM | POA: Insufficient documentation

## 2021-04-24 NOTE — Assessment & Plan Note (Signed)
We will check lipid panel with annual exam Encouraged him to consume a low-fat diet

## 2021-04-24 NOTE — Progress Notes (Signed)
Subjective:    Patient ID: Thomas Lamb, male    DOB: 1951-05-01, 70 y.o.   MRN: [id number]  HPI  Patient presents the clinic today for follow-up of chronic  conditions.  CKD: His last creatinine was 1.25, GFR 57, 06/2020. He is not on an ACEI/ARB. He does not follow with nephrology.  Hypertriglyceridemia: His last LDL was 86, triglycerides 381, 06/2020. He is not taking any cholesterol lowering medication. He tries to consume a low fat diet.  Thrombocytosis: His last platelet count was 433, 06/2020. He does not follow with hematology.  Chronic Pain: Mainly in his left shoulder and back pain.  X-ray of the lumbar spine showed:  He has a history of chronic left shoulder and chronic back pain.  X-ray of the lumbar spine showed:   IMPRESSION: 1. Severe acromioclavicular arthropathy with subchondral edema, cysts and possible erosions in the distal clavicle and acromion. These findings likely contribute to superior shoulder pain. 2. Mild diffuse rotator cuff tendinosis. No evidence of rotator cuff tear. 3. No evidence of labral or biceps tendon pathology.   Xray of the left shoulder showed:   IMPRESSION: Mild AC joint osteoarthritis.   He has had 2 prior surgeries on his lower back.   Indication for chronic opioid: Chronic pain of shoulder and back Medication and dose: Hydrocodone 10-325 mg 1 tab p.o. daily # pills per month: 30 every 2 months Last UDS date: 06/2020 Opioid Treatment Agreement signed (Y/N): Yes Opioid Treatment Agreement last reviewed with patient: 06/2020 [facility] reviewed this encounter (include red flags): No red flags   Review of Systems  Past Medical History:  Diagnosis Date   Chicken pox     Current Outpatient Medications  Medication Sig Dispense Refill   Glucosa-Chondr-Na Chondr-MSM [phone] MG TABS Take 1 tablet by mouth daily.     HYDROcodone-acetaminophen (NORCO) 10-325 MG tablet Take 1 tablet by mouth daily as needed. 30 tablet 0   Multiple  Vitamin (MULTIVITAMIN) tablet Take 1 tablet by mouth daily.     No current facility-administered medications for this visit.    Allergies  Allergen Reactions   Penicillins Hives    Family History  Problem Relation Age of Onset   Arthritis Mother    Arthritis Father    Stroke Father    Hypertension Father    Cancer Neg Hx    Diabetes Neg Hx    Early death Neg Hx     Social History   Socioeconomic History   Marital status: Married    Spouse name: Not on file   Number of children: Not on file   Years of education: Not on file   Highest education level: Not on file  Occupational History   Not on file  Tobacco Use   Smoking status: Former    Pack years: 0.00    Types: Cigarettes    Quit date: 10/14/2002    Years since quitting: 18.5   Smokeless tobacco: Never  Vaping Use   Vaping Use: Never used  Substance and Sexual Activity   Alcohol use: Yes    Alcohol/week: 2.0 standard drinks    Types: 2 Cans of beer per week    Comment: occasional   Drug use: No   Sexual activity: Not on file  Other Topics Concern   Not on file  Social History Narrative   Not on file   Social Determinants of Health   Financial Resource Strain: Not on file  Food Insecurity: Not on file  Transportation Needs: Not on file  Physical Activity: Not on file  Stress: Not on file  Social Connections: Not on file  Intimate Partner Violence: Not on file     Constitutional: Denies fever, malaise, fatigue, headache or abrupt weight changes.  HEENT: Denies eye pain, eye redness, ear pain, ringing in the ears, wax buildup, runny nose, nasal congestion, bloody nose, or sore throat. Respiratory: Denies difficulty breathing, shortness of breath, cough or sputum production.   Cardiovascular: Denies chest pain, chest tightness, palpitations or swelling in the hands or feet.  Gastrointestinal: Denies abdominal pain, bloating, constipation, diarrhea or blood in the stool.  GU: Denies urgency, frequency,  pain with urination, burning sensation, blood in urine, odor or discharge. Musculoskeletal: Patient reports chronic shoulder and back pain.  Denies decrease in range of motion, difficulty with gait, muscle pain or joint swelling.  Skin: Denies redness, rashes, lesions or ulcercations.  Neurological: Denies dizziness, difficulty with memory, difficulty with speech or problems with balance and coordination.  Psych: Denies anxiety, depression, SI/HI.  No other specific complaints in a complete review of systems (except as listed in HPI above).     Objective:   Physical Exam  BP 128/66 (BP Location: Left Arm, Patient Position: Sitting, Cuff Size: Large)   Pulse (!) 57   Temp (!) 97.5 F (36.4 C) (Temporal)   Resp 17   Ht 5' 11.5" (1.816 m)   Wt 241 lb 6.4 oz (109.5 kg)   SpO2 98%   BMI 33.20 kg/m   Wt Readings from Last 3 Encounters:  06/15/20 239 lb (108.4 kg)  06/08/19 231 lb (104.8 kg)  05/11/19 229 lb (103.9 kg)    General: Appears his stated age, obese, in NAD. Skin: Warm, dry and intact.  HEENT: Head: normal shape and size; Eyes: sclera white and EOMs intact;  Cardiovascular: Bradycardic with normal rhythm. S1,S2 noted.  No murmur, rubs or gallops noted.  Pulmonary/Chest: Normal effort and positive vesicular breath sounds. No respiratory distress. No wheezes, rales or ronchi noted.  Musculoskeletal: Normal internal and external rotation of bilateral shoulders.  Pain with palpation over bilateral AC joints.  Normal flexion, extension, rotation and lateral bending of lumbar spine.  Pain with palpation over the lumbar spine.  Strength 5/5 BUE/BLE.  No difficulty with gait.  Neurological: Alert and oriented.  Psychiatric: Mood and affect normal. Behavior is normal. Judgment and thought content normal.     BMET    Component Value Date/Time   NA 140 06/15/2020 0954   K 4.6 06/15/2020 0954   CL 102 06/15/2020 0954   CO2 32 06/15/2020 0954   GLUCOSE 92 06/15/2020 0954   BUN  23 06/15/2020 0954   CREATININE 1.25 06/15/2020 0954   CALCIUM 9.9 06/15/2020 0954    Lipid Panel     Component Value Date/Time   CHOL 160 06/15/2020 0954   TRIG 199.0 (H) 06/15/2020 0954   HDL 33.50 (L) 06/15/2020 0954   CHOLHDL 5 06/15/2020 0954   VLDL 39.8 06/15/2020 0954   LDLCALC 86 06/15/2020 0954    CBC    Component Value Date/Time   WBC 7.2 06/15/2020 0954   RBC 4.80 06/15/2020 0954   HGB 14.7 06/15/2020 0954   HCT 42.4 06/15/2020 0954   PLT 433.0 (H) 06/15/2020 0954   MCV 88.2 06/15/2020 0954   MCHC 34.7 06/15/2020 0954   RDW 14.0 06/15/2020 0954    Hgb A1C Lab Results  Component Value Date   HGBA1C 5.1 08/28/2017  Assessment & Plan:    Webb Silversmith, NP This visit occurred during the SARS-CoV-2 public health emergency.  Safety protocols were in place, including screening questions prior to the visit, additional usage of staff PPE, and extensive cleaning of exam room while observing appropriate contact time as indicated for disinfecting solutions.

## 2021-04-24 NOTE — Assessment & Plan Note (Signed)
We will check CBC with annual exam 

## 2021-04-24 NOTE — Assessment & Plan Note (Signed)
Managed with hydrocodone as needed

## 2021-04-24 NOTE — Assessment & Plan Note (Signed)
We will check c-Met with annual exam Encourage adequate water intake

## 2021-04-24 NOTE — Assessment & Plan Note (Signed)
Managed on hydrocodone as needed

## 2021-04-24 NOTE — Assessment & Plan Note (Signed)
Encourage diet and exercise for weight loss 

## 2021-04-24 NOTE — Patient Instructions (Signed)
Heart-Healthy Eating Plan Heart-healthy meal planning includes: Eating less unhealthy fats. Eating more healthy fats. Making other changes in your diet. Talk with your doctor or a diet specialist (dietitian) to create an eating plan that is right for you. What is my plan? Your doctor may recommend an eating plan that includes: Total fat: ______% or less of total calories a day. Saturated fat: ______% or less of total calories a day. Cholesterol: less than _________mg a day. What are tips for following this plan? Cooking Avoid frying your food. Try to bake, boil, grill, or broil it instead. You can also reduce fat by: Removing the skin from poultry. Removing all visible fats from meats. Steaming vegetables in water or broth. Meal planning  At meals, divide your plate into four equal parts: Fill one-half of your plate with vegetables and green salads. Fill one-fourth of your plate with whole grains. Fill one-fourth of your plate with lean protein foods. Eat 4-5 servings of vegetables per day. A serving of vegetables is: 1 cup of raw or cooked vegetables. 2 cups of raw leafy greens. Eat 4-5 servings of fruit per day. A serving of fruit is: 1 medium whole fruit.  cup of dried fruit.  cup of fresh, frozen, or canned fruit.  cup of 100% fruit juice. Eat more foods that have soluble fiber. These are apples, broccoli, carrots, beans, peas, and barley. Try to get 20-30 g of fiber per day. Eat 4-5 servings of nuts, legumes, and seeds per week: 1 serving of dried beans or legumes equals  cup after being cooked. 1 serving of nuts is  cup. 1 serving of seeds equals 1 tablespoon.  General information Eat more home-cooked food. Eat less restaurant, buffet, and fast food. Limit or avoid alcohol. Limit foods that are high in starch and sugar. Avoid fried foods. Lose weight if you are overweight. Keep track of how much salt (sodium) you eat. This is important if you have high blood  pressure. Ask your doctor to tell you more about this. Try to add vegetarian meals each week. Fats Choose healthy fats. These include olive oil and canola oil, flaxseeds, walnuts, almonds, and seeds. Eat more omega-3 fats. These include salmon, mackerel, sardines, tuna, flaxseed oil, and ground flaxseeds. Try to eat fish at least 2 times each week. Check food labels. Avoid foods with trans fats or high amounts of saturated fat. Limit saturated fats. These are often found in animal products, such as meats, butter, and cream. These are also found in plant foods, such as palm oil, palm kernel oil, and coconut oil. Avoid foods with partially hydrogenated oils in them. These have trans fats. Examples are stick margarine, some tub margarines, cookies, crackers, and other baked goods. What foods can I eat? Fruits All fresh, canned (in natural juice), or frozen fruits. Vegetables Fresh or frozen vegetables (raw, steamed, roasted, or grilled). Green salads. Grains Most grains. Choose whole wheat and whole grains most of the time. Rice andpasta, including brown rice and pastas made with whole wheat. Meats and other proteins Lean, well-trimmed beef, veal, pork, and lamb. Chicken and turkey without skin. All fish and shellfish. Wild duck, rabbit, pheasant, and venison. Egg whites or low-cholesterol egg substitutes. Dried beans, peas, lentils, and tofu. Seedsand most nuts. Dairy Low-fat or nonfat cheeses, including ricotta and mozzarella. Skim or 1% milk that is liquid, powdered, or evaporated. Buttermilk that is made with low-fatmilk. Nonfat or low-fat yogurt. Fats and oils Non-hydrogenated (trans-free) margarines. Vegetable oils, including soybean, sesame,   sunflower, olive, peanut, safflower, corn, canola, and cottonseed. Salad dressings or mayonnaisemade with a vegetable oil. Beverages Mineral water. Coffee and tea. Diet carbonated beverages. Sweets and desserts Sherbet, gelatin, and fruit ice. Small  amounts of dark chocolate. Limit all sweets and desserts. Seasonings and condiments All seasonings and condiments. The items listed above may not be a complete list of foods and drinks you can eat. Contact a dietitian for more options. What foods should I avoid? Fruits Canned fruit in heavy syrup. Fruit in cream or butter sauce. Fried fruit. Limitcoconut. Vegetables Vegetables cooked in cheese, cream, or butter sauce. Fried vegetables. Grains Breads that are made with saturated or trans fats, oils, or whole milk. Croissants. Sweet rolls. Donuts. High-fat crackers,such as cheese crackers. Meats and other proteins Fatty meats, such as hot dogs, ribs, sausage, bacon, rib-eye roast or steak. High-fat deli meats, such as salami and bologna. Caviar. Domestic duck andgoose. Organ meats, such as liver. Dairy Cream, sour cream, cream cheese, and creamed cottage cheese. Whole-milk cheeses. Whole or 2% milk that is liquid, evaporated, or condensed. Whole buttermilk. Cream sauce or high-fat cheese sauce. Yogurt that is made fromwhole milk. Fats and oils Meat fat, or shortening. Cocoa butter, hydrogenated oils, palm oil, coconut oil, palm kernel oil. Solid fats and shortenings, including bacon fat, salt pork, lard, and butter. Nondairy cream substitutes. Salad dressings with cheeseor sour cream. Beverages Regular sodas and juice drinks with added sugar. Sweets and desserts Frosting. Pudding. Cookies. Cakes. Pies. Milk chocolate or white chocolate.Buttered syrups. Full-fat ice cream or ice cream drinks. The items listed above may not be a complete list of foods and drinks to avoid. Contact a dietitian for more information. Summary Heart-healthy meal planning includes eating less unhealthy fats, eating more healthy fats, and making other changes in your diet. Eat a balanced diet. This includes fruits and vegetables, low-fat or nonfat dairy, lean protein, nuts and legumes, whole grains, and heart-healthy  oils and fats. This information is not intended to replace advice given to you by your health care provider. Make sure you discuss any questions you have with your healthcare provider. Document Revised: 12/04/2017 Document Reviewed: 11/07/2017 Elsevier Patient Education  2022 Elsevier Inc.  

## 2021-06-04 ENCOUNTER — Other Ambulatory Visit: Payer: Self-pay | Admitting: Internal Medicine

## 2021-06-04 DIAGNOSIS — Z79899 Other long term (current) drug therapy: Secondary | ICD-10-CM

## 2021-06-04 MED ORDER — HYDROCODONE-ACETAMINOPHEN 10-325 MG PO TABS
1.0000 | ORAL_TABLET | Freq: Every day | ORAL | 0 refills | Status: DC | PRN
Start: 2021-06-04 — End: 2021-07-24

## 2021-06-04 NOTE — Telephone Encounter (Signed)
Copied from CRM [id number]. Topic: Quick Communication - Rx Refill/Question >> Jun 04, 2021  8:33 AM Jaquita Rector A wrote: Medication: HYDROcodone-acetaminophen (NORCO) 10-325 MG tablet   Has the patient contacted their pharmacy? Yes.   (Agent: If no, request that the patient contact the pharmacy for the refill.) (Agent: If yes, when and what did the pharmacy advise?)  Preferred Pharmacy (with phone number or street name): [id number] [place], [place] - [address] AT [facility] [address]  Phone:  [phone] Fax:  [phone]     Agent: Please be advised that RX refills may take up to 3 business days. We ask that you follow-up with your pharmacy.

## 2021-06-04 NOTE — Telephone Encounter (Signed)
Requested medication (s) are due for refill today: yes  Requested medication (s) are on the active medication list: yes   Future visit scheduled:  yes  Notes to clinic: Patient has 2 tablets left  This refill cannot be delegated    Requested Prescriptions  Pending Prescriptions Disp Refills   HYDROcodone-acetaminophen (NORCO) 10-325 MG tablet 30 tablet 0    Sig: Take 1 tablet by mouth daily as needed.     Not Delegated - Analgesics:  Opioid Agonist Combinations Failed - 06/04/2021  8:34 AM      Failed - This refill cannot be delegated      Failed - Urine Drug Screen completed in last 360 days      Passed - Valid encounter within last 6 months    Recent Outpatient Visits           1 month ago Stage 3a chronic kidney disease (HCC)   [facility] [place], Salvadore Oxford, NP       Future Appointments             In 1 year [place], Salvadore Oxford, NP [facility], [facility]

## 2021-07-24 ENCOUNTER — Other Ambulatory Visit: Payer: Self-pay | Admitting: Internal Medicine

## 2021-07-24 DIAGNOSIS — Z79899 Other long term (current) drug therapy: Secondary | ICD-10-CM

## 2021-07-24 NOTE — Telephone Encounter (Signed)
Requested medications are due for refill today.  yes  Requested medications are on the active medications list.  yes  Last refill. 06/04/2021  Future visit scheduled.   yes  Notes to clinic.  Medication not delegated. 

## 2021-07-24 NOTE — Telephone Encounter (Signed)
Medication Refill - Medication: HYDROcodone-acetaminophen (NORCO) 10-325 MG tablet  Has the patient contacted their pharmacy? No. 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 Has the patient been seen for an appointment in the last year OR does the patient have an upcoming appointment? Yes.    Agent: Please be advised that RX refills may take up to 3 business days. We ask that you follow-up with your pharmacy.

## 2021-07-25 MED ORDER — HYDROCODONE-ACETAMINOPHEN 10-325 MG PO TABS: 1.0000 | ORAL_TABLET | Freq: Every day | ORAL | 0 refills | Status: DC | PRN

## 2021-09-25 ENCOUNTER — Other Ambulatory Visit: Payer: Self-pay | Admitting: Internal Medicine

## 2021-09-25 DIAGNOSIS — Z79899 Other long term (current) drug therapy: Secondary | ICD-10-CM

## 2021-09-25 MED ORDER — HYDROCODONE-ACETAMINOPHEN 10-325 MG PO TABS
1.0000 | ORAL_TABLET | Freq: Every day | ORAL | 0 refills | Status: DC | PRN
Start: 2021-09-25 — End: 2021-11-02

## 2021-09-25 NOTE — Telephone Encounter (Signed)
Requested medication (s) are due for refill today - yes  Requested medication (s) are on the active medication list -yes  Future visit scheduled -yes  Last refill: 07/25/21 #30  Notes to clinic: Request RF: non delegated Rx  Requested Prescriptions  Pending Prescriptions Disp Refills   HYDROcodone-acetaminophen (NORCO) 10-325 MG tablet 30 tablet 0    Sig: Take 1 tablet by mouth daily as needed.     Not Delegated - Analgesics:  Opioid Agonist Combinations Failed - 09/25/2021  9:20 AM      Failed - This refill cannot be delegated      Failed - Urine Drug Screen completed in last 360 days      Passed - Valid encounter within last 6 months    Recent Outpatient Visits           5 months ago Stage 3a chronic kidney disease (HCC)   [facility] [place], Salvadore Oxford, NP       Future Appointments             In 1 year [place], Salvadore Oxford, NP [facility], [facility]               Requested Prescriptions  Pending Prescriptions Disp Refills   HYDROcodone-acetaminophen (NORCO) 10-325 MG tablet 30 tablet 0    Sig: Take 1 tablet by mouth daily as needed.     Not Delegated - Analgesics:  Opioid Agonist Combinations Failed - 09/25/2021  9:20 AM      Failed - This refill cannot be delegated      Failed - Urine Drug Screen completed in last 360 days      Passed - Valid encounter within last 6 months    Recent Outpatient Visits           5 months ago Stage 3a chronic kidney disease (HCC)   [facility] [place], Salvadore Oxford, NP       Future Appointments             In 1 year [place], Salvadore Oxford, NP [facility], [facility]

## 2021-09-25 NOTE — Telephone Encounter (Signed)
Medication Refill - Medication: HYDROcodone-acetaminophen (NORCO) 10-325 MG tablet  Has the patient contacted their pharmacy? Yes.   (Was told to call his dr for this Rx Preferred Pharmacy (with phone number or street name): [facility] #[id number] - [place], [place] - [address] Has the patient been seen for an appointment in the last year OR does the patient have an upcoming appointment? Yes.    Agent: Please be advised that RX refills may take up to 3 business days. We ask that you follow-up with your pharmacy.

## 2021-11-02 ENCOUNTER — Other Ambulatory Visit: Payer: Self-pay

## 2021-11-02 ENCOUNTER — Encounter: Payer: Self-pay | Admitting: Internal Medicine

## 2021-11-02 ENCOUNTER — Ambulatory Visit: Payer: BC Managed Care – PPO | Admitting: Internal Medicine

## 2021-11-02 VITALS — BP 134/80 | HR 53 | Ht 71.5 in | Wt 242.0 lb

## 2021-11-02 DIAGNOSIS — Z79899 Other long term (current) drug therapy: Secondary | ICD-10-CM | POA: Diagnosis not present

## 2021-11-02 DIAGNOSIS — R29898 Other symptoms and signs involving the musculoskeletal system: Secondary | ICD-10-CM | POA: Diagnosis not present

## 2021-11-02 DIAGNOSIS — R202 Paresthesia of skin: Secondary | ICD-10-CM | POA: Diagnosis not present

## 2021-11-02 MED ORDER — HYDROCODONE-ACETAMINOPHEN 10-325 MG PO TABS: 1.0000 | ORAL_TABLET | Freq: Every day | ORAL | 0 refills | Status: DC | PRN

## 2021-11-02 NOTE — Patient Instructions (Signed)
Ankle Exercises Ask your health care provider which exercises are safe for you. Do exercises exactly as told by your health care provider and adjust them as directed. It is normal to feel mild stretching, pulling, tightness, or mild discomfort as you do these exercises. Stop right away if you feel sudden pain or your pain gets worse. Do not begin these exercises until told by your health care provider. Stretching and range-of-motion exercises These exercises warm up your muscles and joints and improve the movement and flexibility of your ankle. These exercises may also help to relieve pain. Dorsiflexion/plantar flexion  Sit with your __________ knee straight or bent. Do not rest your foot on anything. Flex your __________ ankle to tilt the top of your foot toward your shin. This is called dorsiflexion. Hold this position for __________ seconds. Point your toes downward to tilt the top of your foot away from your shin. This is called plantar flexion. Hold this position for __________ seconds. Repeat __________ times. Complete this exercise __________ times a day. Ankle alphabet  Sit with your __________ foot supported at your lower leg. Do not rest your foot on anything. Make sure your foot has room to move freely. Think of your __________ foot as a paintbrush: Move your foot to trace each letter of the alphabet in the air. Keep your hip and knee still while you trace the letters. Make the letters as large as you can without causing or increasing any discomfort. Repeat __________ times. Complete this exercise __________ times a day. Passive ankle dorsiflexion This is an exercise in which something or someone moves your ankle for you. You do not move it yourself. Sit on a chair that is placed on a non-carpeted surface. Place your __________ foot on the floor, directly under your __________ knee. Extend your __________ leg for support. Keeping your heel down, slide your __________ foot back  toward the chair until you feel a stretch at your ankle or calf. If you do not feel a stretch, slide your buttocks forward to the edge of the chair while keeping your heel down. Hold this stretch for __________ seconds. Repeat __________ times. Complete this exercise __________ times a day. Strengthening exercises These exercises build strength and endurance in your ankle. Endurance is the ability to use your muscles for a long time, even after they get tired. Dorsiflexors These are muscles that lift your foot up. Secure a rubber exercise band or tube to an object, such as a table leg, that will stay still when the band is pulled. Secure the other end around your __________ foot. Sit on the floor, facing the object with your __________ leg extended. The band or tube should be slightly tense when your foot is relaxed. Slowly flex your __________ ankle and toes to bring your foot toward your shin. Hold this position for __________ seconds. Slowly return your foot to the starting position, controlling the band as you do that. Repeat __________ times. Complete this exercise __________ times a day. Plantar flexors These are muscles that push your foot down. Sit on the floor with your __________ leg extended. Loop a rubber exercise band or tube around the ball of your __________ foot. The ball of your foot is on the walking surface, right under your toes. The band or tube should be slightly tense when your foot is relaxed. Slowly point your toes downward, pushing them away from you. Hold this position for __________ seconds. Slowly release the tension in the band or tube, controlling   smoothly until your foot is back in the starting position. Repeat __________ times. Complete this exercise __________ times a day. Towel curls  Sit in a chair on a non-carpeted surface, and put your feet on the floor. Place a towel in front of your feet. If told by your health care provider, add a __________ lb / kg  weight to the end of the towel. Keeping your heel on the floor, put your __________ foot on the towel. Pull the towel toward you by grabbing the towel with your toes and curling them under. Keep your heel on the floor. Let your toes relax. Grab the towel again. Keep pulling the towel until it is completely underneath your foot. Repeat __________ times. Complete this exercise __________ times a day. Standing plantar flexion This is an exercise in which you use your toes to lift your body's weight while standing. Stand with your feet shoulder-width apart. Keep your weight spread evenly over the width of your feet while you rise up on your toes. Use a wall or table to steady yourself if needed, but try not to use it for support. If this exercise is too easy, try these options: Shift your weight toward your __________ leg until you feel challenged. If told by your health care provider, lift your uninjured leg off the floor. Hold this position for __________ seconds. Repeat __________ times. Complete this exercise __________ times a day. Tandem walking Stand with one foot directly in front of the other. Slowly raise your back foot up, lifting your heel before your toes, and place it directly in front of your other foot. Continue to walk in this heel-to-toe way for __________ or for as long as told by your health care provider. Have a countertop or wall nearby to use if needed to keep your balance, but try not to hold onto anything for support. Repeat __________ times. Complete this exercise __________ times a day. This information is not intended to replace advice given to you by your health care provider. Make sure you discuss any questions you have with your health care provider. Document Revised: 11/21/2020 Document Reviewed: 11/21/2020 Elsevier Patient Education  2022 [employer] Inc.  

## 2021-11-02 NOTE — Progress Notes (Signed)
Subjective:    Patient ID: Thomas Lamb, male    DOB: 28-Jun-1951, 71 y.o.   MRN: [id number]  Patient presents to the clinic today with complaint of right ankle weakness.  This started years ago after repeated injuries.  He reports there is not much pain.  He reports associated numbness and tingling in the right ankle that extends into the foot.  He is having difficulty with ambulation and had multiple falls secondary to this ankle weakness.  He reports chronic low back pain, takes Hydrocodone for this. He has had 2 back surgeries in the past.      Past Medical History:  Diagnosis Date   Chicken pox     Current Outpatient Medications  Medication Sig Dispense Refill   Glucosa-Chondr-Na Chondr-MSM [id number] MG TABS Take 1 tablet by mouth daily.     HYDROcodone-acetaminophen (NORCO) 10-325 MG tablet Take 1 tablet by mouth daily as needed. 30 tablet 0   Multiple Vitamin (MULTIVITAMIN) tablet Take 1 tablet by mouth daily.     No current facility-administered medications for this visit.    Allergies  Allergen Reactions   Penicillins Hives    Family History  Problem Relation Age of Onset   Arthritis Mother    Arthritis Father    Stroke Father    Hypertension Father    Cancer Neg Hx    Diabetes Neg Hx    Early death Neg Hx     Social History   Socioeconomic History   Marital status: Married    Spouse name: Not on file   Number of children: Not on file   Years of education: Not on file   Highest education level: Not on file  Occupational History   Not on file  Tobacco Use   Smoking status: Former    Types: Cigarettes    Quit date: 10/14/2002    Years since quitting: 19.0   Smokeless tobacco: Never  Vaping Use   Vaping Use: Never used  Substance and Sexual Activity   Alcohol use: Yes    Alcohol/week: 2.0 standard drinks    Types: 2 Cans of beer per week    Comment: occasional   Drug use: No   Sexual activity: Not on file  Other Topics Concern   Not on file   Social History Narrative   Not on file   Social Determinants of Health   Financial Resource Strain: Not on file  Food Insecurity: Not on file  Transportation Needs: Not on file  Physical Activity: Not on file  Stress: Not on file  Social Connections: Not on file  Intimate Partner Violence: Not on file     Constitutional: Denies fever, malaise, fatigue, headache or abrupt weight changes.  Respiratory: Denies difficulty breathing, shortness of breath, cough or sputum production.   Cardiovascular: Denies chest pain, chest tightness, palpitations or swelling in the hands or feet.  Musculoskeletal: Pt reports right ankle weakness, decrease in range of motion, difficulty with gait. Denies  muscle pain or joint pain or swelling.  Skin: Denies redness, rashes, lesions or ulcercations.  Neurological: Patient reports numbness and tingling right ankle, problems with balance.  Denies dizziness, difficulty with memory, difficulty with speech.    No other specific complaints in a complete review of systems (except as listed in HPI above).  Objective:   Physical Exam BP 134/80    Pulse (!) 53    Ht 5' 11.5" (1.816 m)    Wt 242 lb (109.8 kg)  SpO2 100%    BMI 33.28 kg/m   There were no vitals taken for this visit. Wt Readings from Last 3 Encounters:  04/24/21 241 lb 6.4 oz (109.5 kg)  06/15/20 239 lb (108.4 kg)  06/08/19 231 lb (104.8 kg)    General: Appears his stated age, obese, in NAD. Cardiovascular: Normal rate and rhythm. Pulmonary/Chest: Normal effort. Musculoskeletal: Muscle wasting noted of the right calf.  Decreased dorsiflexion and rotation of the right ankle.  Normal plantar flexion.  No joint swelling noted.  Strength 5/5B LE.  He has difficulty standing on his tiptoes and heels.  Gait slow and steady without device. Neurological: Alert and oriented.  Sensation intact but decreased to the right lower extremity.   BMET    Component Value Date/Time   NA 140 06/15/2020  0954   K 4.6 06/15/2020 0954   CL 102 06/15/2020 0954   CO2 32 06/15/2020 0954   GLUCOSE 92 06/15/2020 0954   BUN 23 06/15/2020 0954   CREATININE 1.25 06/15/2020 0954   CALCIUM 9.9 06/15/2020 0954    Lipid Panel     Component Value Date/Time   CHOL 160 06/15/2020 0954   TRIG 199.0 (H) 06/15/2020 0954   HDL 33.50 (L) 06/15/2020 0954   CHOLHDL 5 06/15/2020 0954   VLDL 39.8 06/15/2020 0954   LDLCALC 86 06/15/2020 0954    CBC    Component Value Date/Time   WBC 7.2 06/15/2020 0954   RBC 4.80 06/15/2020 0954   HGB 14.7 06/15/2020 0954   HCT 42.4 06/15/2020 0954   PLT 433.0 (H) 06/15/2020 0954   MCV 88.2 06/15/2020 0954   MCHC 34.7 06/15/2020 0954   RDW 14.0 06/15/2020 0954    Hgb A1C Lab Results  Component Value Date   HGBA1C 5.1 08/28/2017           Assessment & Plan:   Right Ankle Weakness, Paresthesia secondary to Multiple Ankle Injuries:  X-ray right ankle today Referral to PT placed for strengthening and stabilization Consider MRI if weakness and paresthesia persist Continue hydrocodone as previously prescribed  We will follow-up after imaging with further recommendations and treatment plan Webb Silversmith, NP This visit occurred during the SARS-CoV-2 public health emergency.  Safety protocols were in place, including screening questions prior to the visit, additional usage of staff PPE, and extensive cleaning of exam room while observing appropriate contact time as indicated for disinfecting solutions.

## 2021-11-05 ENCOUNTER — Ambulatory Visit
Admission: RE | Admit: 2021-11-05 | Discharge: 2021-11-05 | Disposition: A | Discharge status: Home / Self Care | Payer: BC Managed Care – PPO | Source: Ambulatory Visit | Attending: Internal Medicine | Admitting: Internal Medicine

## 2021-11-05 ENCOUNTER — Other Ambulatory Visit: Payer: Self-pay

## 2021-11-05 ENCOUNTER — Ambulatory Visit
Admission: RE | Admit: 2021-11-05 | Discharge: 2021-11-05 | Disposition: A | Discharge status: Home / Self Care | Payer: BC Managed Care – PPO | Attending: Internal Medicine | Admitting: Internal Medicine

## 2021-11-05 DIAGNOSIS — R202 Paresthesia of skin: Secondary | ICD-10-CM | POA: Insufficient documentation

## 2021-11-20 DIAGNOSIS — M25571 Pain in right ankle and joints of right foot: Secondary | ICD-10-CM | POA: Diagnosis not present

## 2021-11-20 DIAGNOSIS — M62571 Muscle wasting and atrophy, not elsewhere classified, right ankle and foot: Secondary | ICD-10-CM | POA: Diagnosis not present

## 2021-11-27 DIAGNOSIS — M25571 Pain in right ankle and joints of right foot: Secondary | ICD-10-CM | POA: Diagnosis not present

## 2021-11-27 DIAGNOSIS — M62571 Muscle wasting and atrophy, not elsewhere classified, right ankle and foot: Secondary | ICD-10-CM | POA: Diagnosis not present

## 2021-12-25 DIAGNOSIS — L853 Xerosis cutis: Secondary | ICD-10-CM | POA: Diagnosis not present

## 2021-12-25 DIAGNOSIS — L821 Other seborrheic keratosis: Secondary | ICD-10-CM | POA: Diagnosis not present

## 2021-12-25 DIAGNOSIS — D1801 Hemangioma of skin and subcutaneous tissue: Secondary | ICD-10-CM | POA: Diagnosis not present

## 2021-12-25 DIAGNOSIS — Z129 Encounter for screening for malignant neoplasm, site unspecified: Secondary | ICD-10-CM | POA: Diagnosis not present

## 2022-01-02 ENCOUNTER — Other Ambulatory Visit: Payer: Self-pay | Admitting: Internal Medicine

## 2022-01-02 DIAGNOSIS — Z79899 Other long term (current) drug therapy: Secondary | ICD-10-CM

## 2022-01-02 NOTE — Telephone Encounter (Signed)
Medication Refill - Medication:  ?HYDROcodone-acetaminophen (NORCO) 10-325 MG tablet  ? ?Has the patient contacted their pharmacy? Yes.  Contact pcp-monthly ? ?Preferred Pharmacy (with phone number or street name):  ?[id number] Nicholes Rough, [place] - [address] AT [facility] OF [address]  ?[address] [place], [place] [address]  ?Phone:  [phone]  Fax:  [phone]  ? ?Has the patient been seen for an appointment in the last year OR does the patient have an upcoming appointment? Yes.   ? ?Agent: Please be advised that RX refills may take up to 3 business days. We ask that you follow-up with your pharmacy. ?

## 2022-01-03 MED ORDER — HYDROCODONE-ACETAMINOPHEN 10-325 MG PO TABS: 1.0000 | ORAL_TABLET | Freq: Every day | ORAL | 0 refills | Status: DC | PRN

## 2022-01-03 NOTE — Telephone Encounter (Signed)
Requested medication (s) are due for refill today: Yes ? ?Requested medication (s) are on the active medication list: Yes ? ?Last refill:  11/02/21 ? ?Future visit scheduled: Yes ? ?Notes to clinic:  Unable to refill per protocol, cannot delegate. ? ? ? ? ? ? ?Requested Prescriptions  ?Pending Prescriptions Disp Refills  ? HYDROcodone-acetaminophen (NORCO) 10-325 MG tablet 30 tablet 0  ?  Sig: Take 1 tablet by mouth daily as needed.  ?  ? Not Delegated - Analgesics:  Opioid Agonist Combinations Failed - 01/03/2022 10:43 AM  ?  ?  Failed - This refill cannot be delegated  ?  ?  Failed - Urine Drug Screen completed in last 360 days  ?  ?  Passed - Valid encounter within last 3 months  ?  Recent Outpatient Visits   ? ?      ? 2 months ago Paresthesia of right lower extremity  ? [facility] [place], [place] W, NP  ? 8 months ago Stage 3a chronic kidney disease [facility])  ? [facility] [place], Salvadore Oxford, NP  ? ?  ?  ?Future Appointments   ? ?        ? In 9 months Baity, Salvadore Oxford, NP [facility]  ? ?  ? ?  ?  ?  ? ? ? ? ?

## 2022-02-07 ENCOUNTER — Other Ambulatory Visit: Payer: Self-pay | Admitting: Internal Medicine

## 2022-02-07 DIAGNOSIS — Z79899 Other long term (current) drug therapy: Secondary | ICD-10-CM

## 2022-02-07 NOTE — Telephone Encounter (Signed)
Medication Refill - Medication:  ?HYDROcodone-acetaminophen (NORCO) 10-325 MG tablet 30 tablet 0 01/03/2022    ?Sig - Route: Take 1 tablet by mouth daily as needed. - Oral   ?Sent to pharmacy as: HYDROcodone-acetaminophen (NORCO) 10-325 MG tablet   ?Earliest Fill Date: 01/03/2022   ?E-Prescribing Status: Receipt confirmed by pharmacy (01/03/2022  4:04 PM EDT   ? ? ?Has the patient contacted their pharmacy? Yes.   ?(Agent: If no, request that the patient contact the pharmacy for the refill. If patient does not wish to contact the pharmacy document the reason why and proceed with request.) ?(Agent: If yes, when and what did the pharmacy advise?) no refills ? ?Preferred Pharmacy (with phone number or street name):  ?[facility] #[id number] Nicholes Rough, [place] - [address] AT [facility] OF [address]  ?[address] [place] [address]  ?Phone: [phone] Fax: [phone]  ? ?Has the patient been seen for an appointment in the last year OR does the patient have an upcoming appointment? Yes.   ? ?Agent: Please be advised that RX refills may take up to 3 business days. We ask that you follow-up with your pharmacy. ? ?

## 2022-02-08 MED ORDER — HYDROCODONE-ACETAMINOPHEN 10-325 MG PO TABS: 1.0000 | ORAL_TABLET | Freq: Every day | ORAL | 0 refills | Status: DC | PRN

## 2022-02-08 NOTE — Telephone Encounter (Signed)
Requested medication (s) are due for refill today:   Provider to review ? ?Requested medication (s) are on the active medication list:   Yes ? ?Future visit scheduled:   Yes in 8 months with Rene Kocher ? ? ?Last ordered: 01/03/2022 #30, 0 refills ? ?Returned because it's a non delegated refill per protocol.  ? ?Requested Prescriptions  ?Pending Prescriptions Disp Refills  ? HYDROcodone-acetaminophen (NORCO) 10-325 MG tablet 30 tablet 0  ?  Sig: Take 1 tablet by mouth daily as needed.  ?  ? Not Delegated - Analgesics:  Opioid Agonist Combinations Failed - 02/07/2022  9:48 AM  ?  ?  Failed - This refill cannot be delegated  ?  ?  Failed - Urine Drug Screen completed in last 360 days  ?  ?  Failed - Valid encounter within last 3 months  ?  Recent Outpatient Visits   ? ?      ? 3 months ago Paresthesia of right lower extremity  ? [facility] [place], [place] W, NP  ? 9 months ago Stage 3a chronic kidney disease [facility])  ? [facility] [place], Salvadore Oxford, NP  ? ?  ?  ?Future Appointments   ? ?        ? In 8 months Baity, Salvadore Oxford, NP [facility], [facility]  ? ?  ? ? ?  ?  ?  ? ?

## 2022-04-05 ENCOUNTER — Other Ambulatory Visit: Payer: Self-pay | Admitting: Internal Medicine

## 2022-04-05 DIAGNOSIS — Z79899 Other long term (current) drug therapy: Secondary | ICD-10-CM

## 2022-04-05 MED ORDER — HYDROCODONE-ACETAMINOPHEN 10-325 MG PO TABS: 1.0000 | ORAL_TABLET | Freq: Every day | ORAL | 0 refills | Status: DC | PRN

## 2022-05-28 ENCOUNTER — Other Ambulatory Visit: Payer: Self-pay | Admitting: Internal Medicine

## 2022-05-28 DIAGNOSIS — Z79899 Other long term (current) drug therapy: Secondary | ICD-10-CM

## 2022-05-28 NOTE — Telephone Encounter (Signed)
Requested medication (s) are due for refill today: yes  Requested medication (s) are on the active medication list: yes    Last refill: 04/05/22  #30  0 refills  Future visit scheduled yes 10/29/22  Notes to clinic:Not delegated, please review. Thank you.  Requested Prescriptions  Pending Prescriptions Disp Refills   HYDROcodone-acetaminophen (NORCO) 10-325 MG tablet 30 tablet 0    Sig: Take 1 tablet by mouth daily as needed.     Not Delegated - Analgesics:  Opioid Agonist Combinations Failed - 05/28/2022 12:26 PM      Failed - This refill cannot be delegated      Failed - Urine Drug Screen completed in last 360 days      Failed - Valid encounter within last 3 months    Recent Outpatient Visits           6 months ago Paresthesia of right lower extremity   [facility] [place], Salvadore Oxford, NP   1 year ago Stage 3a chronic kidney disease [facility])   [facility], Salvadore Oxford, NP       Future Appointments             In 5 months Baity, Salvadore Oxford, NP [facility]

## 2022-05-28 NOTE — Telephone Encounter (Signed)
Medication Refill - Medication: Hydrocodone 10-325  Has the patient contacted their pharmacy? No. (Agent: If no, request that the patient contact the pharmacy for the refill. If patient does not wish to contact the pharmacy document the reason why and proceed with request.) (Agent: If yes, when and what did the pharmacy advise?)  Preferred Pharmacy (with phone number or street name): [facility] [address] Has the patient been seen for an appointment in the last year OR does the patient have an upcoming appointment? Yes.    Agent: Please be advised that RX refills may take up to 3 business days. We ask that you follow-up with your pharmacy.

## 2022-05-29 MED ORDER — HYDROCODONE-ACETAMINOPHEN 10-325 MG PO TABS: 1.0000 | ORAL_TABLET | Freq: Every day | ORAL | 0 refills | Status: DC | PRN

## 2022-05-30 DIAGNOSIS — E785 Hyperlipidemia, unspecified: Secondary | ICD-10-CM | POA: Insufficient documentation

## 2022-05-30 DIAGNOSIS — R7301 Impaired fasting glucose: Secondary | ICD-10-CM | POA: Insufficient documentation

## 2022-05-30 DIAGNOSIS — E669 Obesity, unspecified: Secondary | ICD-10-CM | POA: Insufficient documentation

## 2022-05-30 DIAGNOSIS — E782 Mixed hyperlipidemia: Secondary | ICD-10-CM | POA: Insufficient documentation

## 2022-05-31 ENCOUNTER — Ambulatory Visit (INDEPENDENT_AMBULATORY_CARE_PROVIDER_SITE_OTHER): Payer: BC Managed Care – PPO

## 2022-05-31 VITALS — Wt 242.0 lb

## 2022-05-31 DIAGNOSIS — Z0000 Encounter for general adult medical examination without abnormal findings: Secondary | ICD-10-CM | POA: Diagnosis not present

## 2022-05-31 NOTE — Progress Notes (Signed)
Virtual Visit via Telephone Note  I connected with  Thomas Lamb on 05/31/22 at 11:15 AM EDT by telephone and verified that I am speaking with the correct person using two identifiers.  Location: Patient: HOME Provider: [facility] Persons participating in the virtual visit: patient/Nurse Health Advisor   I discussed the limitations, risks, security and privacy concerns of performing an evaluation and management service by telephone and the availability of in person appointments. The patient expressed understanding and agreed to proceed.  Interactive audio and video telecommunications were attempted between this nurse and patient, however failed, due to patient having technical difficulties OR patient did not have access to video capability.  We continued and completed visit with audio only.  Some vital signs may be absent or patient reported.   Hal Hope, LPN  Subjective:   Thomas Lamb is a 71 y.o. male who presents for Medicare Annual/Subsequent preventive examination.  Review of Systems     Cardiac Risk Factors include: advanced age (>40men, >24 women);male gender     Objective:    There were no vitals filed for this visit. There is no height or weight on file to calculate BMI.     05/31/2022   11:08 AM 01/17/2019   10:37 AM  Advanced Directives  Does Patient Have a Medical Advance Directive? No No  Would patient like information on creating a medical advance directive? No - Patient declined No - Patient declined    Current Medications (verified) Outpatient Encounter Medications as of 05/31/2022  Medication Sig   Glucosa-Chondr-Na Chondr-MSM [phone] MG TABS Take 1 tablet by mouth daily.   HYDROcodone-acetaminophen (NORCO) 10-325 MG tablet Take 1 tablet by mouth daily as needed.   Multiple Vitamin (MULTIVITAMIN) tablet Take 1 tablet by mouth daily.   triamcinolone cream (KENALOG) 0.1 % Apply 1 Application topically 2 (two) times daily.   No  facility-administered encounter medications on file as of 05/31/2022.    Allergies (verified) Penicillins   History: Past Medical History:  Diagnosis Date   Chicken pox    Past Surgical History:  Procedure Laterality Date   APPENDECTOMY  2007   BACK SURGERY  [phone]   KNEE SURGERY Left 1966   calcium removal   TONSILLECTOMY AND ADENOIDECTOMY  1957   Family History  Problem Relation Age of Onset   Arthritis Mother    Arthritis Father    Stroke Father    Hypertension Father    Cancer Neg Hx    Diabetes Neg Hx    Early death Neg Hx    Social History   Socioeconomic History   Marital status: Married    Spouse name: Not on file   Number of children: Not on file   Years of education: Not on file   Highest education level: Not on file  Occupational History   Not on file  Tobacco Use   Smoking status: Former    Types: Cigarettes    Quit date: 10/14/2002    Years since quitting: 19.6   Smokeless tobacco: Never  Vaping Use   Vaping Use: Never used  Substance and Sexual Activity   Alcohol use: Yes    Alcohol/week: 2.0 standard drinks of alcohol    Types: 2 Cans of beer per week    Comment: occasional   Drug use: No   Sexual activity: Not on file  Other Topics Concern   Not on file  Social History Narrative   Not on file   Social Determinants of Health  Financial Resource Strain: Low Risk  (05/31/2022)   Overall Financial Resource Strain (CARDIA)    Difficulty of Paying Living Expenses: Not hard at all  Food Insecurity: No Food Insecurity (05/31/2022)   Hunger Vital Sign    Worried About Running Out of Food in the Last Year: Never true    Ran Out of Food in the Last Year: Never true  Transportation Needs: No Transportation Needs (05/31/2022)   PRAPARE - Administrator, Civil Service (Medical): No    Lack of Transportation (Non-Medical): No  Physical Activity: Insufficiently Active (05/31/2022)   Exercise Vital Sign    Days of Exercise per Week: 2  days    Minutes of Exercise per Session: 20 min  Stress: No Stress Concern Present (05/31/2022)   [employer] of Occupational Health - Occupational Stress Questionnaire    Feeling of Stress : Not at all  Social Connections: Moderately Isolated (05/31/2022)   Social Connection and Isolation Panel [NHANES]    Frequency of Communication with Friends and Family: More than three times a week    Frequency of Social Gatherings with Friends and Family: Once a week    Attends Religious Services: Never    Database administrator or Organizations: No    Attends Engineer, structural: Never    Marital Status: Married    Tobacco Counseling Counseling given: Not Answered   Clinical Intake:  Pre-visit preparation completed: Yes  Pain : No/denies pain     Diabetes: No  How often do you need to have someone help you when you read instructions, pamphlets, or other written materials from your doctor or pharmacy?: 1 - Never  Diabetic?NO  Interpreter Needed?: No  Information entered by :: Kennedy Bucker, LPN   Activities of Daily Living    05/31/2022   11:09 AM  In your present state of health, do you have any difficulty performing the following activities:  Hearing? 0  Vision? 0  Difficulty concentrating or making decisions? 0  Walking or climbing stairs? 0  Dressing or bathing? 0  Doing errands, shopping? 0  Preparing Food and eating ? N  Using the Toilet? N  In the past six months, have you accidently leaked urine? N  Do you have problems with loss of bowel control? N  Managing your Medications? N  Managing your Finances? N  Housekeeping or managing your Housekeeping? N    Patient Care Team: Lorre Munroe, NP as PCP - General (Internal Medicine)  Indicate any recent Medical Services you may have received from other than [facility] providers in the past year (date may be approximate).     Assessment:   This is a routine wellness examination for  Thomas Lamb.  Hearing/Vision screen Hearing Screening - Comments:: NO AIDS Vision Screening - Comments:: [facility]- [facility] [place]  Dietary issues and exercise activities discussed: Current Exercise Habits: Home exercise routine, Type of exercise: walking, Time (Minutes): 20, Frequency (Times/Week): 2, Weekly Exercise (Minutes/Week): 40, Intensity: Mild   Goals Addressed             This Visit's Progress    DIET - EAT MORE FRUITS AND VEGETABLES         Depression Screen    05/31/2022   11:06 AM 11/02/2021    2:44 PM 04/24/2021    8:40 AM 06/15/2020    9:37 AM 05/11/2019   10:40 AM 02/04/2019    2:16 PM 08/28/2017    8:29 AM  PHQ 2/9 Scores  PHQ - 2 Score 0 0 0 0 0 0 0  PHQ- 9 Score 0 2 0  0      Fall Risk    05/31/2022   11:09 AM 11/02/2021    2:44 PM 04/24/2021    8:41 AM 05/11/2019   10:40 AM 08/28/2017    8:29 AM  Fall Risk   Falls in the past year? 0 0 1 1 No  Number falls in past yr: 0 0 1 1   Injury with Fall? 0 0 0 0   Risk for fall due to : No Fall Risks No Fall Risks Impaired balance/gait    Follow up Falls evaluation completed Falls evaluation completed Falls evaluation completed      FALL RISK PREVENTION PERTAINING TO THE HOME:  Any stairs in or around the home? Yes  If so, are there any without handrails? No  Home free of loose throw rugs in walkways, pet beds, electrical cords, etc? Yes  Adequate lighting in your home to reduce risk of falls? Yes   ASSISTIVE DEVICES UTILIZED TO PREVENT FALLS:  Life alert? No  Use of a cane, walker or w/c? No  Grab bars in the bathroom? Yes  Shower chair or bench in shower? Yes  Elevated toilet seat or a handicapped toilet? No    Cognitive Function:        05/31/2022   11:10 AM  6CIT Screen  What Year? 0 points  What month? 0 points  What time? 0 points  Count back from 20 0 points  Months in reverse 0 points  Repeat phrase 0 points  Total Score 0 points    Immunizations Immunization History  Administered  Date(s) Administered   PFIZER(Purple Top)SARS-COV-2 Vaccination 01/04/2020, 01/26/2020, 08/11/2020    TDAP status: Due, Education has been provided regarding the importance of this vaccine. Advised may receive this vaccine at local pharmacy or Health Dept. Aware to provide a copy of the vaccination record if obtained from local pharmacy or Health Dept. Verbalized acceptance and understanding.  Flu Vaccine status: Declined, Education has been provided regarding the importance of this vaccine but patient still declined. Advised may receive this vaccine at local pharmacy or Health Dept. Aware to provide a copy of the vaccination record if obtained from local pharmacy or Health Dept. Verbalized acceptance and understanding.  Pneumococcal vaccine status: Declined,  Education has been provided regarding the importance of this vaccine but patient still declined. Advised may receive this vaccine at local pharmacy or Health Dept. Aware to provide a copy of the vaccination record if obtained from local pharmacy or Health Dept. Verbalized acceptance and understanding.   Covid-19 vaccine status: Completed vaccines  Qualifies for Shingles Vaccine? Yes   Zostavax completed No   Shingrix Completed?: No.    Education has been provided regarding the importance of this vaccine. Patient has been advised to call insurance company to determine out of pocket expense if they have not yet received this vaccine. Advised may also receive vaccine at local pharmacy or Health Dept. Verbalized acceptance and understanding.  Screening Tests Health Maintenance  Topic Date Due   TETANUS/TDAP  Never done   Zoster Vaccines- Shingrix (1 of 2) Never done   COLONOSCOPY (Pts 45-55yrs Insurance coverage will need to be confirmed)  Never done   Pneumonia Vaccine 36+ Years old (1 - PCV) Never done   COVID-19 Vaccine (4 - Pfizer risk series) 10/06/2020   INFLUENZA VACCINE  05/14/2022   Hepatitis C Screening  Addressed  HPV VACCINES   Aged Out    Health Maintenance  Health Maintenance Due  Topic Date Due   TETANUS/TDAP  Never done   Zoster Vaccines- Shingrix (1 of 2) Never done   COLONOSCOPY (Pts 45-24yrs Insurance coverage will need to be confirmed)  Never done   Pneumonia Vaccine 30+ Years old (1 - PCV) Never done   COVID-19 Vaccine (4 - Pfizer risk series) 10/06/2020   INFLUENZA VACCINE  05/14/2022    DECLINED REFERRAL FOR COLONOSCOPY  Lung Cancer Screening: (Low Dose CT Chest recommended if Age 3-80 years, 30 pack-year currently smoking OR have quit w/in 15years.) does not qualify.   Additional Screening:  Hepatitis C Screening: does qualify; Completed 07/15/16  Vision Screening: Recommended annual ophthalmology exams for early detection of glaucoma and other disorders of the eye. Is the patient up to date with their annual eye exam?  Yes  Who is the provider or what is the name of the office in which the patient attends annual eye exams? [facility] [place] If pt is not established with a provider, would they like to be referred to a provider to establish care? No .   Dental Screening: Recommended annual dental exams for proper oral hygiene  Community Resource Referral / Chronic Care Management: CRR required this visit?  No   CCM required this visit?  No      Plan:     I have personally reviewed and noted the following in the patient's chart:   Medical and social history Use of alcohol, tobacco or illicit drugs  Current medications and supplements including opioid prescriptions. Patient is currently taking opioid prescriptions. Information provided to patient regarding non-opioid alternatives. Patient advised to discuss non-opioid treatment plan with their provider. Functional ability and status Nutritional status Physical activity Advanced directives List of other physicians Hospitalizations, surgeries, and ER visits in previous 12 months Vitals Screenings to include cognitive, depression, and  falls Referrals and appointments  In addition, I have reviewed and discussed with patient certain preventive protocols, quality metrics, and best practice recommendations. A written personalized care plan for preventive services as well as general preventive health recommendations were provided to patient.     Hal Hope, LPN   [id number]   Nurse Notes: [facility]

## 2022-05-31 NOTE — Patient Instructions (Signed)
[name] , Thank you for taking time to come for your Medicare Wellness Visit. I appreciate your ongoing commitment to your health goals. Please review the following plan we discussed and let me know if I can assist you in the future.   Screening recommendations/referrals: Colonoscopy: DECLINED REFERRAL Recommended yearly ophthalmology/optometry visit for glaucoma screening and checkup Recommended yearly dental visit for hygiene and checkup  Vaccinations: Influenza vaccine: N/D Pneumococcal vaccine: N/D Tdap vaccine: N/D Shingles vaccine: N/D   Covid-19: 01/04/20, 01/26/20  Advanced directives: NO  Conditions/risks identified: NONE  Next appointment: Follow up in one year for your annual wellness visit. 06/06/23 @ 11 AM BY PHONE  Preventive Care 65 Years and Older, Male Preventive care refers to lifestyle choices and visits with your health care provider that can promote health and wellness. What does preventive care include? A yearly physical exam. This is also called an annual well check. Dental exams once or twice a year. Routine eye exams. Ask your health care provider how often you should have your eyes checked. Personal lifestyle choices, including: Daily care of your teeth and gums. Regular physical activity. Eating a healthy diet. Avoiding tobacco and drug use. Limiting alcohol use. Practicing safe sex. Taking low doses of aspirin every day. Taking vitamin and mineral supplements as recommended by your health care provider. What happens during an annual well check? The services and screenings done by your health care provider during your annual well check will depend on your age, overall health, lifestyle risk factors, and family history of disease. Counseling  Your health care provider may ask you questions about your: Alcohol use. Tobacco use. Drug use. Emotional well-being. Home and relationship well-being. Sexual activity. Eating habits. History of  falls. Memory and ability to understand (cognition). Work and work Astronomer. Screening  You may have the following tests or measurements: Height, weight, and BMI. Blood pressure. Lipid and cholesterol levels. These may be checked every 5 years, or more frequently if you are over 71 years old. Skin check. Lung cancer screening. You may have this screening every year starting at age 72 if you have a 30-pack-year history of smoking and currently smoke or have quit within the past 15 years. Fecal occult blood test (FOBT) of the stool. You may have this test every year starting at age 60. Flexible sigmoidoscopy or colonoscopy. You may have a sigmoidoscopy every 5 years or a colonoscopy every 10 years starting at age 26. Prostate cancer screening. Recommendations will vary depending on your family history and other risks. Hepatitis C blood test. Hepatitis B blood test. Sexually transmitted disease (STD) testing. Diabetes screening. This is done by checking your blood sugar (glucose) after you have not eaten for a while (fasting). You may have this done every 1-3 years. Abdominal aortic aneurysm (AAA) screening. You may need this if you are a current or former smoker. Osteoporosis. You may be screened starting at age 72 if you are at high risk. Talk with your health care provider about your test results, treatment options, and if necessary, the need for more tests. Vaccines  Your health care provider may recommend certain vaccines, such as: Influenza vaccine. This is recommended every year. Tetanus, diphtheria, and acellular pertussis (Tdap, Td) vaccine. You may need a Td booster every 10 years. Zoster vaccine. You may need this after age 72. Pneumococcal 13-valent conjugate (PCV13) vaccine. One dose is recommended after age 72. Pneumococcal polysaccharide (PPSV23) vaccine. One dose is recommended after age 72. Talk to your health  care provider about which screenings and vaccines you need and  how often you need them. This information is not intended to replace advice given to you by your health care provider. Make sure you discuss any questions you have with your health care provider. Document Released: 10/27/2015 Document Revised: 06/19/2016 Document Reviewed: 08/01/2015 Elsevier Interactive Patient Education  2017 [place] Prevention in the Home Falls can cause injuries. They can happen to people of all ages. There are many things you can do to make your home safe and to help prevent falls. What can I do on the outside of my home? Regularly fix the edges of walkways and driveways and fix any cracks. Remove anything that might make you trip as you walk through a door, such as a raised step or threshold. Trim any bushes or trees on the path to your home. Use bright outdoor lighting. Clear any walking paths of anything that might make someone trip, such as rocks or tools. Regularly check to see if handrails are loose or broken. Make sure that both sides of any steps have handrails. Any raised decks and porches should have guardrails on the edges. Have any leaves, snow, or ice cleared regularly. Use sand or salt on walking paths during winter. Clean up any spills in your garage right away. This includes oil or grease spills. What can I do in the bathroom? Use night lights. Install grab bars by the toilet and in the tub and shower. Do not use towel bars as grab bars. Use non-skid mats or decals in the tub or shower. If you need to sit down in the shower, use a plastic, non-slip stool. Keep the floor dry. Clean up any water that spills on the floor as soon as it happens. Remove soap buildup in the tub or shower regularly. Attach bath mats securely with double-sided non-slip rug tape. Do not have throw rugs and other things on the floor that can make you trip. What can I do in the bedroom? Use night lights. Make sure that you have a light by your bed that is easy to  reach. Do not use any sheets or blankets that are too big for your bed. They should not hang down onto the floor. Have a firm chair that has side arms. You can use this for support while you get dressed. Do not have throw rugs and other things on the floor that can make you trip. What can I do in the kitchen? Clean up any spills right away. Avoid walking on wet floors. Keep items that you use a lot in easy-to-reach places. If you need to reach something above you, use a strong step stool that has a grab bar. Keep electrical cords out of the way. Do not use floor polish or wax that makes floors slippery. If you must use wax, use non-skid floor wax. Do not have throw rugs and other things on the floor that can make you trip. What can I do with my stairs? Do not leave any items on the stairs. Make sure that there are handrails on both sides of the stairs and use them. Fix handrails that are broken or loose. Make sure that handrails are as long as the stairways. Check any carpeting to make sure that it is firmly attached to the stairs. Fix any carpet that is loose or worn. Avoid having throw rugs at the top or bottom of the stairs. If you do have throw rugs, attach them to  the floor with carpet tape. Make sure that you have a light switch at the top of the stairs and the bottom of the stairs. If you do not have them, ask someone to add them for you. What else can I do to help prevent falls? Wear shoes that: Do not have high heels. Have rubber bottoms. Are comfortable and fit you well. Are closed at the toe. Do not wear sandals. If you use a stepladder: Make sure that it is fully opened. Do not climb a closed stepladder. Make sure that both sides of the stepladder are locked into place. Ask someone to hold it for you, if possible. Clearly mark and make sure that you can see: Any grab bars or handrails. First and last steps. Where the edge of each step is. Use tools that help you move  around (mobility aids) if they are needed. These include: Canes. Walkers. Scooters. Crutches. Turn on the lights when you go into a dark area. Replace any light bulbs as soon as they burn out. Set up your furniture so you have a clear path. Avoid moving your furniture around. If any of your floors are uneven, fix them. If there are any pets around you, be aware of where they are. Review your medicines with your doctor. Some medicines can make you feel dizzy. This can increase your chance of falling. Ask your doctor what other things that you can do to help prevent falls. This information is not intended to replace advice given to you by your health care provider. Make sure you discuss any questions you have with your health care provider. Document Released: 07/27/2009 Document Revised: 03/07/2016 Document Reviewed: 11/04/2014 Elsevier Interactive Patient Education  2017 [employer].

## 2022-07-02 DIAGNOSIS — H5203 Hypermetropia, bilateral: Secondary | ICD-10-CM | POA: Diagnosis not present

## 2022-07-18 ENCOUNTER — Other Ambulatory Visit: Payer: Self-pay | Admitting: Internal Medicine

## 2022-07-18 DIAGNOSIS — Z79899 Other long term (current) drug therapy: Secondary | ICD-10-CM

## 2022-07-18 MED ORDER — HYDROCODONE-ACETAMINOPHEN 10-325 MG PO TABS: 1.0000 | ORAL_TABLET | Freq: Every day | ORAL | 0 refills | Status: DC | PRN

## 2022-07-18 NOTE — Telephone Encounter (Signed)
Copied from [place]. Topic: General - Other >> Jul 18, 2022 10:48 AM Everette C wrote: Reason for CRM: Medication Refill - Medication: HYDROcodone-acetaminophen (NORCO) 10-325 MG tablet [[id number]]   Has the patient contacted their pharmacy? No. (Agent: If no, request that the patient contact the pharmacy for the refill. If patient does not wish to contact the pharmacy document the reason why and proceed with request.) (Agent: If yes, when and what did the pharmacy advise?)  Preferred Pharmacy (with phone number or street name): [id number] [address], [place] [address] Phone: [phone] Fax: [phone] Hours: Not open 24 hours   Has the patient been seen for an appointment in the last year OR does the patient have an upcoming appointment? Yes.    Agent: Please be advised that RX refills may take up to 3 business days. We ask that you follow-up with your pharmacy.

## 2022-07-18 NOTE — Telephone Encounter (Signed)
Requested medication (s) are due for refill today: yes  Requested medication (s) are on the active medication list: yes  Last refill:  05/29/22 #30/0  Future visit scheduled: yes  Notes to clinic:  Unable to refill per protocol, cannot delegate.      Requested Prescriptions  Pending Prescriptions Disp Refills   HYDROcodone-acetaminophen (NORCO) 10-325 MG tablet 30 tablet 0    Sig: Take 1 tablet by mouth daily as needed.     Not Delegated - Analgesics:  Opioid Agonist Combinations Failed - 07/18/2022 11:10 AM      Failed - This refill cannot be delegated      Failed - Urine Drug Screen completed in last 360 days      Passed - Valid encounter within last 3 months    Recent Outpatient Visits           8 months ago Paresthesia of right lower extremity   [facility] [place], Coralie Keens, NP   1 year ago Stage 3a chronic kidney disease [facility]   [facility], Coralie Keens, NP       Future Appointments             In 3 months Baity, Coralie Keens, NP [facility], [facility]

## 2022-07-30 ENCOUNTER — Telehealth: Payer: Self-pay | Admitting: Internal Medicine

## 2022-07-30 DIAGNOSIS — Z79899 Other long term (current) drug therapy: Secondary | ICD-10-CM

## 2022-07-30 MED ORDER — HYDROCODONE-ACETAMINOPHEN 10-325 MG PO TABS: 1.0000 | ORAL_TABLET | Freq: Every day | ORAL | 0 refills | Status: DC | PRN

## 2022-07-30 NOTE — Addendum Note (Signed)
Addended by: Jearld Fenton on: 07/30/2022 02:16 PM   Modules accepted: Orders

## 2022-07-30 NOTE — Telephone Encounter (Signed)
Pt states [facility] is out of HYDROcodone-acetaminophen (NORCO) 10-325 MG tablet And it is back ordered, has been for a month.  Pt would like you to re send this Rx to   [facility], [place]

## 2022-09-26 ENCOUNTER — Other Ambulatory Visit: Payer: Self-pay | Admitting: Internal Medicine

## 2022-09-26 DIAGNOSIS — Z79899 Other long term (current) drug therapy: Secondary | ICD-10-CM

## 2022-09-26 MED ORDER — HYDROCODONE-ACETAMINOPHEN 10-325 MG PO TABS: 1.0000 | ORAL_TABLET | Freq: Every day | ORAL | 0 refills | Status: DC | PRN

## 2022-09-26 NOTE — Telephone Encounter (Signed)
Medication Refill - Medication: HYDROcodone-acetaminophen (NORCO) 10-325 MG tablet   Has the patient contacted their pharmacy? No   Preferred Pharmacy (with phone number or street name):  [id number] [place], [place] - [address] AT [facility] [address] Phone: [phone]  Fax: [phone]     Has the patient been seen for an appointment in the last year OR does the patient have an upcoming appointment? Yes.    Please assist patient further

## 2022-09-26 NOTE — Telephone Encounter (Signed)
Requested medication (s) are due for refill today - yes  Requested medication (s) are on the active medication list -yes  Future visit scheduled -yes  Last refill: 07/30/22 #30  Notes to clinic: non delegated Rx  Requested Prescriptions  Pending Prescriptions Disp Refills   HYDROcodone-acetaminophen (NORCO) 10-325 MG tablet 30 tablet 0    Sig: Take 1 tablet by mouth daily as needed.     Not Delegated - Analgesics:  Opioid Agonist Combinations Failed - 09/26/2022  9:25 AM      Failed - This refill cannot be delegated      Failed - Urine Drug Screen completed in last 360 days      Failed - Valid encounter within last 3 months    Recent Outpatient Visits           10 months ago Paresthesia of right lower extremity   [facility] [place], Salvadore Oxford, NP   1 year ago Stage 3a chronic kidney disease [facility])   [facility], Salvadore Oxford, NP       Future Appointments             In 1 month Baity, Salvadore Oxford, NP [facility], [facility]               Requested Prescriptions  Pending Prescriptions Disp Refills   HYDROcodone-acetaminophen (NORCO) 10-325 MG tablet 30 tablet 0    Sig: Take 1 tablet by mouth daily as needed.     Not Delegated - Analgesics:  Opioid Agonist Combinations Failed - 09/26/2022  9:25 AM      Failed - This refill cannot be delegated      Failed - Urine Drug Screen completed in last 360 days      Failed - Valid encounter within last 3 months    Recent Outpatient Visits           10 months ago Paresthesia of right lower extremity   [facility] [place], Salvadore Oxford, NP   1 year ago Stage 3a chronic kidney disease [facility])   [facility], Salvadore Oxford, NP       Future Appointments             In 1 month Baity, Salvadore Oxford, NP [facility], [facility]

## 2022-10-29 ENCOUNTER — Ambulatory Visit (INDEPENDENT_AMBULATORY_CARE_PROVIDER_SITE_OTHER): Payer: BC Managed Care – PPO | Admitting: Internal Medicine

## 2022-10-29 ENCOUNTER — Encounter: Payer: Self-pay | Admitting: Internal Medicine

## 2022-10-29 VITALS — BP 132/82 | HR 60 | Temp 97.1°F | Ht 72.0 in | Wt 242.0 lb

## 2022-10-29 DIAGNOSIS — E6609 Other obesity due to excess calories: Secondary | ICD-10-CM | POA: Diagnosis not present

## 2022-10-29 DIAGNOSIS — R7309 Other abnormal glucose: Secondary | ICD-10-CM

## 2022-10-29 DIAGNOSIS — Z125 Encounter for screening for malignant neoplasm of prostate: Secondary | ICD-10-CM | POA: Diagnosis not present

## 2022-10-29 DIAGNOSIS — Z6832 Body mass index (BMI) 32.0-32.9, adult: Secondary | ICD-10-CM

## 2022-10-29 DIAGNOSIS — E782 Mixed hyperlipidemia: Secondary | ICD-10-CM | POA: Diagnosis not present

## 2022-10-29 DIAGNOSIS — E66811 Other obesity due to excess calories: Secondary | ICD-10-CM

## 2022-10-29 DIAGNOSIS — Z79899 Other long term (current) drug therapy: Secondary | ICD-10-CM

## 2022-10-29 DIAGNOSIS — Z0001 Encounter for general adult medical examination with abnormal findings: Secondary | ICD-10-CM | POA: Diagnosis not present

## 2022-10-29 DIAGNOSIS — R739 Hyperglycemia, unspecified: Secondary | ICD-10-CM

## 2022-10-29 MED ORDER — HYDROCODONE-ACETAMINOPHEN 10-325 MG PO TABS: 1.0000 | ORAL_TABLET | Freq: Every day | ORAL | 0 refills | Status: DC | PRN

## 2022-10-29 NOTE — Progress Notes (Signed)
Subjective:    Patient ID: Thomas Lamb, male    DOB: 1951-02-26, 72 y.o.   MRN: [id number]  HPI  Patient presents to clinic today for his annual exam.  Flu: never Tetanus: > 10 years ago COVID: Pfizer x 3 Pneumovax: never Prevnar: never Shingrix: never PSA screening: 06/2020 Colon screening: > 10 years ago Vision screening: annually Dentist: biannually  Diet: He does eat meat.  He consumes fruits and vegetables.  He does eat some fried foods.  He drinks mostly flavored water and Coke 0. Exercise: [employer] water [employer]  Review of Systems     Past Medical History:  Diagnosis Date   Chicken pox     Current Outpatient Medications  Medication Sig Dispense Refill   Glucosa-Chondr-Na Chondr-MSM 848 [id number] MG TABS Take 1 tablet by mouth daily.     HYDROcodone-acetaminophen (NORCO) 10-325 MG tablet Take 1 tablet by mouth daily as needed. 30 tablet 0   Multiple Vitamin (MULTIVITAMIN) tablet Take 1 tablet by mouth daily.     triamcinolone cream (KENALOG) 0.1 % Apply 1 Application topically 2 (two) times daily.     No current facility-administered medications for this visit.    Allergies  Allergen Reactions   Penicillins Hives    Family History  Problem Relation Age of Onset   Arthritis Mother    Arthritis Father    Stroke Father    Hypertension Father    Cancer Neg Hx    Diabetes Neg Hx    Early death Neg Hx     Social History   Socioeconomic History   Marital status: Married    Spouse name: Not on file   Number of children: Not on file   Years of education: Not on file   Highest education level: Not on file  Occupational History   Not on file  Tobacco Use   Smoking status: Former    Types: Cigarettes    Quit date: 10/14/2002    Years since quitting: 20.0   Smokeless tobacco: Never  Vaping Use   Vaping Use: Never used  Substance and Sexual Activity   Alcohol use: Yes    Alcohol/week: 2.0 standard drinks of alcohol    Types: 2 Cans of beer per  week    Comment: occasional   Drug use: No   Sexual activity: Not on file  Other Topics Concern   Not on file  Social History Narrative   Not on file   Social Determinants of Health   Financial Resource Strain: Low Risk  (05/31/2022)   Overall Financial Resource Strain (CARDIA)    Difficulty of Paying Living Expenses: Not hard at all  Food Insecurity: No Food Insecurity (05/31/2022)   Hunger Vital Sign    Worried About Running Out of Food in the Last Year: Never true    Ran Out of Food in the Last Year: Never true  Transportation Needs: No Transportation Needs (05/31/2022)   PRAPARE - Administrator, Civil Service (Medical): No    Lack of Transportation (Non-Medical): No  Physical Activity: Insufficiently Active (05/31/2022)   Exercise Vital Sign    Days of Exercise per Week: 2 days    Minutes of Exercise per Session: 20 min  Stress: No Stress Concern Present (05/31/2022)   [employer] of Occupational Health - Occupational Stress Questionnaire    Feeling of Stress : Not at all  Social Connections: Moderately Isolated (05/31/2022)   Social Connection and Isolation Panel [NHANES]    Frequency  of Communication with Friends and Family: More than three times a week    Frequency of Social Gatherings with Friends and Family: Once a week    Attends Religious Services: Never    Marine scientist or Organizations: No    Attends Archivist Meetings: Never    Marital Status: Married  Human resources officer Violence: Not At Risk (05/31/2022)   Humiliation, Afraid, Rape, and Kick questionnaire    Fear of Current or Ex-Partner: No    Emotionally Abused: No    Physically Abused: No    Sexually Abused: No     Constitutional: Denies fever, malaise, fatigue, headache or abrupt weight changes.  HEENT: Denies eye pain, eye redness, ear pain, ringing in the ears, wax buildup, runny nose, nasal congestion, bloody nose, or sore throat. Respiratory: Denies difficulty  breathing, shortness of breath, cough or sputum production.   Cardiovascular: Denies chest pain, chest tightness, palpitations or swelling in the hands or feet.  Gastrointestinal: Denies abdominal pain, bloating, constipation, diarrhea or blood in the stool.  GU: Denies urgency, frequency, pain with urination, burning sensation, blood in urine, odor or discharge. Musculoskeletal: Pt reports chronic back and shoulder pain. Denies decrease in range of motion, difficulty with gait, or joint pain and swelling.  Skin: Denies redness, rashes, lesions or ulcercations.  Neurological: Denies dizziness, difficulty with memory, difficulty with speech or problems with balance and coordination.  Psych: Denies anxiety, depression, SI/HI.  No other specific complaints in a complete review of systems (except as listed in HPI above).  Objective:   Physical Exam   BP 132/82 (BP Location: Left Arm, Patient Position: Sitting, Cuff Size: Large)   Pulse 60   Temp (!) 97.1 F (36.2 C) (Temporal)   Ht 6' (1.829 m)   Wt 242 lb (109.8 kg)   SpO2 99%   BMI 32.82 kg/m   Wt Readings from Last 3 Encounters:  05/31/22 242 lb (109.8 kg)  11/02/21 242 lb (109.8 kg)  04/24/21 241 lb 6.4 oz (109.5 kg)    General: Appears his stated age, obese, in NAD. Skin: Warm, dry and intact.  HEENT: Head: normal shape and size; Eyes: sclera white, no icterus, conjunctiva pink, PERRLA and EOMs intact;  Neck:  Neck supple, trachea midline. No masses, lumps or thyromegaly present.  Cardiovascular: Normal rate and rhythm. S1,S2 noted.  No murmur, rubs or gallops noted. No JVD or BLE edema. No carotid bruits noted. Pulmonary/Chest: Normal effort and positive vesicular breath sounds. No respiratory distress. No wheezes, rales or ronchi noted.  Abdomen: Normal bowel sounds.  Musculoskeletal: Strength 5/5 BUE/BLE.  No difficulty with gait.  Neurological: Alert and oriented. Cranial nerves II-XII grossly intact. Coordination normal.   Psychiatric: Mood and affect normal. Behavior is normal. Judgment and thought content normal.     BMET    Component Value Date/Time   NA 140 06/15/2020 0954   K 4.6 06/15/2020 0954   CL 102 06/15/2020 0954   CO2 32 06/15/2020 0954   GLUCOSE 92 06/15/2020 0954   BUN 23 06/15/2020 0954   CREATININE 1.25 06/15/2020 0954   CALCIUM 9.9 06/15/2020 0954    Lipid Panel     Component Value Date/Time   CHOL 160 06/15/2020 0954   TRIG 199.0 (H) 06/15/2020 0954   HDL 33.50 (L) 06/15/2020 0954   CHOLHDL 5 06/15/2020 0954   VLDL 39.8 06/15/2020 0954   LDLCALC 86 06/15/2020 0954    CBC    Component Value Date/Time   WBC  7.2 06/15/2020 0954   RBC 4.80 06/15/2020 0954   HGB 14.7 06/15/2020 0954   HCT 42.4 06/15/2020 0954   PLT 433.0 (H) 06/15/2020 0954   MCV 88.2 06/15/2020 0954   MCHC 34.7 06/15/2020 0954   RDW 14.0 06/15/2020 0954    Hgb A1C Lab Results  Component Value Date   HGBA1C 5.1 08/28/2017           Assessment & Plan:   Preventative Health Maintenance:  He declines flu shot He declines tetanus booster Encouraged him to get his COVID-vaccine He declines Pneumovax and Prevnar Discussed Shingrix, he will check coverage with his insurance company and schedule a visit at the pharmacy if he would like to have this done He declines colon cancer screening Encouraged him to consume a balanced diet and exercise regimen Advised him to see an eye doctor and dentist annually We will check CBC, c-Met, lipid, A1c and PSA today  RTC in 6 months, follow-up chronic conditions Webb Silversmith, NP

## 2022-10-29 NOTE — Patient Instructions (Signed)
Health Maintenance After Age 73 After age 73, you are at a higher risk for certain long-term diseases and infections as well as injuries from falls. Falls are a major cause of broken bones and head injuries in people who are older than age 73. Getting regular preventive care can help to keep you healthy and well. Preventive care includes getting regular testing and making lifestyle changes as recommended by your health care provider. Talk with your health care provider about: Which screenings and tests you should have. A screening is a test that checks for a disease when you have no symptoms. A diet and exercise plan that is right for you. What should I know about screenings and tests to prevent falls? Screening and testing are the best ways to find a health problem early. Early diagnosis and treatment give you the best chance of managing medical conditions that are common after age 73. Certain conditions and lifestyle choices may make you more likely to have a fall. Your health care provider may recommend: Regular vision checks. Poor vision and conditions such as cataracts can make you more likely to have a fall. If you wear glasses, make sure to get your prescription updated if your vision changes. Medicine review. Work with your health care provider to regularly review all of the medicines you are taking, including over-the-counter medicines. Ask your health care provider about any side effects that may make you more likely to have a fall. Tell your health care provider if any medicines that you take make you feel dizzy or sleepy. Strength and balance checks. Your health care provider may recommend certain tests to check your strength and balance while standing, walking, or changing positions. Foot health exam. Foot pain and numbness, as well as not wearing proper footwear, can make you more likely to have a fall. Screenings, including: Osteoporosis screening. Osteoporosis is a condition that causes  the bones to get weaker and break more easily. Blood pressure screening. Blood pressure changes and medicines to control blood pressure can make you feel dizzy. Depression screening. You may be more likely to have a fall if you have a fear of falling, feel depressed, or feel unable to do activities that you used to do. Alcohol use screening. Using too much alcohol can affect your balance and may make you more likely to have a fall. Follow these instructions at home: Lifestyle Do not drink alcohol if: Your health care provider tells you not to drink. If you drink alcohol: Limit how much you have to: 0-1 drink a day for women. 0-2 drinks a day for men. Know how much alcohol is in your drink. In the [place] one drink equals one 12 oz bottle of beer (355 mL), one 5 oz glass of wine (148 mL), or one 1 oz glass of hard liquor (44 mL). Do not use any products that contain nicotine or tobacco. These products include cigarettes, chewing tobacco, and vaping devices, such as e-cigarettes. If you need help quitting, ask your health care provider. Activity  Follow a regular exercise program to stay fit. This will help you maintain your balance. Ask your health care provider what types of exercise are appropriate for you. If you need a cane or walker, use it as recommended by your health care provider. Wear supportive shoes that have nonskid soles. Safety  Remove any tripping hazards, such as rugs, cords, and clutter. Install safety equipment such as grab bars in bathrooms and safety rails on stairs. Keep rooms and walkways   well-lit. General instructions Talk with your health care provider about your risks for falling. Tell your health care provider if: You fall. Be sure to tell your health care provider about all falls, even ones that seem minor. You feel dizzy, tiredness (fatigue), or off-balance. Take over-the-counter and prescription medicines only as told by your health care provider. These include  supplements. Eat a healthy diet and maintain a healthy weight. A healthy diet includes low-fat dairy products, low-fat (lean) meats, and fiber from whole grains, beans, and lots of fruits and vegetables. Stay current with your vaccines. Schedule regular health, dental, and eye exams. Summary Having a healthy lifestyle and getting preventive care can help to protect your health and wellness after age 73. Screening and testing are the best way to find a health problem early and help you avoid having a fall. Early diagnosis and treatment give you the best chance for managing medical conditions that are more common for people who are older than age 73. Falls are a major cause of broken bones and head injuries in people who are older than age 73. Take precautions to prevent a fall at home. Work with your health care provider to learn what changes you can make to improve your health and wellness and to prevent falls. This information is not intended to replace advice given to you by your health care provider. Make sure you discuss any questions you have with your health care provider. Document Revised: 02/19/2021 Document Reviewed: 02/19/2021 Elsevier Patient Education  2023 Elsevier Inc.  

## 2022-10-29 NOTE — Assessment & Plan Note (Signed)
Encourage diet and exercise for weight loss 

## 2022-10-30 LAB — COMPLETE METABOLIC PANEL WITH GFR
AG Ratio: 1.9 (calc) (ref 1.0–2.5)
ALT: 29 U/L (ref 9–46)
AST: 18 U/L (ref 10–35)
Albumin: 4.3 g/dL (ref 3.6–5.1)
Alkaline phosphatase (APISO): 55 U/L (ref 35–144)
BUN/Creatinine Ratio: 16 (calc) (ref 6–22)
BUN: 20 mg/dL (ref 7–25)
CO2: 30 mmol/L (ref 20–32)
Calcium: 9.7 mg/dL (ref 8.6–10.3)
Chloride: 104 mmol/L (ref 98–110)
Creat: 1.29 mg/dL — ABNORMAL HIGH (ref 0.70–1.28)
Globulin: 2.3 g/dL (calc) (ref 1.9–3.7)
Glucose, Bld: 105 mg/dL — ABNORMAL HIGH (ref 65–99)
Potassium: 4.4 mmol/L (ref 3.5–5.3)
Sodium: 141 mmol/L (ref 135–146)
Total Bilirubin: 0.5 mg/dL (ref 0.2–1.2)
Total Protein: 6.6 g/dL (ref 6.1–8.1)
eGFR: 59 mL/min/{1.73_m2} — ABNORMAL LOW (ref >=60)

## 2022-10-30 LAB — CBC
HCT: 42.2 % (ref 38.5–50.0)
Hemoglobin: 14.5 g/dL (ref 13.2–17.1)
MCH: 30.6 pg (ref 27.0–33.0)
MCHC: 34.4 g/dL (ref 32.0–36.0)
MCV: 89 fL (ref 80.0–100.0)
MPV: 9.3 fL (ref 7.5–12.5)
Platelets: 366 10*3/uL (ref 140–400)
RBC: 4.74 10*6/uL (ref 4.20–5.80)
RDW: 13.7 % (ref 11.0–15.0)
WBC: 5.8 10*3/uL (ref 3.8–10.8)

## 2022-10-30 LAB — LIPID PANEL
Cholesterol: 140 mg/dL (ref <200)
HDL: 37 mg/dL — ABNORMAL LOW (ref >=40)
LDL Cholesterol (Calc): 78 mg/dL (calc)
Non-HDL Cholesterol (Calc): 103 mg/dL (calc) (ref <130)
Total CHOL/HDL Ratio: 3.8 (calc) (ref <5.0)
Triglycerides: 156 mg/dL — ABNORMAL HIGH (ref <150)

## 2022-10-30 LAB — HEMOGLOBIN A1C
Hgb A1c MFr Bld: 5.4 % of total Hgb (ref <5.7)
Mean Plasma Glucose: 108 mg/dL
eAG (mmol/L): 6 mmol/L

## 2022-10-30 LAB — PSA: PSA: 0.48 ng/mL (ref <=4.00)

## 2022-12-26 ENCOUNTER — Other Ambulatory Visit: Payer: Self-pay | Admitting: Internal Medicine

## 2022-12-26 DIAGNOSIS — Z79899 Other long term (current) drug therapy: Secondary | ICD-10-CM

## 2022-12-26 NOTE — Telephone Encounter (Signed)
Medication Refill - Medication: HYDROcodone-acetaminophen (NORCO) 10-325 MG tablet   Has the patient contacted their pharmacy? No because this is controlled substance (Agent: If no, request that the patient contact the pharmacy for the refill. If patient does not wish to contact the pharmacy document the reason why and proceed with request.) (Agent: If yes, when and what did the pharmacy advise?)contacted pcp directly  Preferred Pharmacy (with phone number or street name):  [facility] [id number], [place] Phone: [phone]  Fax: [phone]     Has the patient been seen for an appointment in the last year OR does the patient have an upcoming appointment? yes  Agent: Please be advised that RX refills may take up to 3 business days. We ask that you follow-up with your pharmacy.

## 2022-12-26 NOTE — Telephone Encounter (Signed)
Requested medication (s) are due for refill today - yes  Requested medication (s) are on the active medication list -yes  Future visit scheduled -no  Last refill: 10/29/22 #30  Notes to clinic: non delegated Rx  Requested Prescriptions  Pending Prescriptions Disp Refills   HYDROcodone-acetaminophen (NORCO) 10-325 MG tablet 30 tablet 0    Sig: Take 1 tablet by mouth daily as needed.     Not Delegated - Analgesics:  Opioid Agonist Combinations Failed - 12/26/2022 10:24 AM      Failed - This refill cannot be delegated      Failed - Urine Drug Screen completed in last 360 days      Passed - Valid encounter within last 3 months    Recent Outpatient Visits           1 month ago Encounter for general adult medical examination with abnormal findings   [facility] [place], Coralie Keens, NP   1 year ago Paresthesia of right lower extremity   [facility] [place], Coralie Keens, NP   1 year ago Stage 3a chronic kidney disease [facility])   [facility] [place], Coralie Keens, NP                 Requested Prescriptions  Pending Prescriptions Disp Refills   HYDROcodone-acetaminophen (NORCO) 10-325 MG tablet 30 tablet 0    Sig: Take 1 tablet by mouth daily as needed.     Not Delegated - Analgesics:  Opioid Agonist Combinations Failed - 12/26/2022 10:24 AM      Failed - This refill cannot be delegated      Failed - Urine Drug Screen completed in last 360 days      Passed - Valid encounter within last 3 months    Recent Outpatient Visits           1 month ago Encounter for general adult medical examination with abnormal findings   [facility] [place], Coralie Keens, NP   1 year ago Paresthesia of right lower extremity   [facility], Coralie Keens, NP   1 year ago Stage 3a chronic kidney disease [facility])   [facility] [place], Coralie Keens, [place]

## 2022-12-27 MED ORDER — HYDROCODONE-ACETAMINOPHEN 10-325 MG PO TABS: 1.0000 | ORAL_TABLET | Freq: Every day | ORAL | 0 refills | Status: DC | PRN

## 2023-02-11 ENCOUNTER — Other Ambulatory Visit: Payer: Self-pay | Admitting: Internal Medicine

## 2023-02-11 DIAGNOSIS — Z79899 Other long term (current) drug therapy: Secondary | ICD-10-CM

## 2023-02-11 NOTE — Telephone Encounter (Signed)
Medication Refill - Medication: HYDROcodone-acetaminophen (NORCO) 10-325 MG tablet [[id number]]   Has the patient contacted their pharmacy? Yes.    (Agent: If yes, when and what did the pharmacy advise?) Contact PCP   Preferred Pharmacy (with phone number or street name): [facility][id number] - [place], [place] - [address]   Has the patient been seen for an appointment in the last year OR does the patient have an upcoming appointment? Yes.    Agent: Please be advised that RX refills may take up to 3 business days. We ask that you follow-up with your pharmacy.

## 2023-02-12 MED ORDER — HYDROCODONE-ACETAMINOPHEN 10-325 MG PO TABS: 1.0000 | ORAL_TABLET | Freq: Every day | ORAL | 0 refills | Status: DC | PRN

## 2023-02-12 NOTE — Telephone Encounter (Signed)
Requested medication (s) are due for refill today: yes  Requested medication (s) are on the active medication list: yes  Last refill:  12/27/22  Future visit scheduled: yes  Notes to clinic:  Unable to refill per protocol, cannot delegate.      Requested Prescriptions  Pending Prescriptions Disp Refills   HYDROcodone-acetaminophen (NORCO) 10-325 MG tablet 30 tablet 0    Sig: Take 1 tablet by mouth daily as needed.     Not Delegated - Analgesics:  Opioid Agonist Combinations Failed - 02/11/2023 11:30 AM      Failed - This refill cannot be delegated      Failed - Urine Drug Screen completed in last 360 days      Failed - Valid encounter within last 3 months    Recent Outpatient Visits           3 months ago Encounter for general adult medical examination with abnormal findings   [facility], Salvadore Oxford, NP   1 year ago Paresthesia of right lower extremity   [facility] [place], Salvadore Oxford, NP   1 year ago Stage 3a chronic kidney disease [facility])   [facility] [place], Salvadore Oxford, [place]

## 2023-04-03 DIAGNOSIS — J019 Acute sinusitis, unspecified: Secondary | ICD-10-CM | POA: Diagnosis not present

## 2023-04-10 ENCOUNTER — Other Ambulatory Visit: Payer: Self-pay | Admitting: Internal Medicine

## 2023-04-10 DIAGNOSIS — Z79899 Other long term (current) drug therapy: Secondary | ICD-10-CM

## 2023-04-10 MED ORDER — HYDROCODONE-ACETAMINOPHEN 10-325 MG PO TABS: 1.0000 | ORAL_TABLET | Freq: Every day | ORAL | 0 refills | Status: DC | PRN

## 2023-04-10 NOTE — Telephone Encounter (Signed)
Medication Refill - Medication: HYDROcodone-acetaminophen (NORCO) 10-325 MG tablet   Has the patient contacted their pharmacy? No.  Preferred Pharmacy (with phone number or street name):  [facility] #[id number] [place], [place] - [address] AT [facility] [address] Phone: [phone]  Fax: [phone]     Has the patient been seen for an appointment in the last year OR does the patient have an upcoming appointment? Yes.    Agent: Please be advised that RX refills may take up to 3 business days. We ask that you follow-up with your pharmacy.

## 2023-04-10 NOTE — Telephone Encounter (Signed)
Requested medication (s) are due for refill today - yes  Requested medication (s) are on the active medication list -yes  Future visit scheduled -no  Last refill: 02/12/23 #30  Notes to clinic: non delegated Rx  Requested Prescriptions  Pending Prescriptions Disp Refills   HYDROcodone-acetaminophen (NORCO) 10-325 MG tablet 30 tablet 0    Sig: Take 1 tablet by mouth daily as needed.     Not Delegated - Analgesics:  Opioid Agonist Combinations Failed - 04/10/2023  2:29 PM      Failed - This refill cannot be delegated      Failed - Urine Drug Screen completed in last 360 days      Failed - Valid encounter within last 3 months    Recent Outpatient Visits           5 months ago Encounter for general adult medical examination with abnormal findings   [facility] [place], Salvadore Oxford, NP   1 year ago Paresthesia of right lower extremity   [place] [facility] [place], Salvadore Oxford, NP   1 year ago Stage 3a chronic kidney disease [facility])   [facility] [place], Salvadore Oxford, NP                 Requested Prescriptions  Pending Prescriptions Disp Refills   HYDROcodone-acetaminophen (NORCO) 10-325 MG tablet 30 tablet 0    Sig: Take 1 tablet by mouth daily as needed.     Not Delegated - Analgesics:  Opioid Agonist Combinations Failed - 04/10/2023  2:29 PM      Failed - This refill cannot be delegated      Failed - Urine Drug Screen completed in last 360 days      Failed - Valid encounter within last 3 months    Recent Outpatient Visits           5 months ago Encounter for general adult medical examination with abnormal findings   [place] [facility] [place], Salvadore Oxford, NP   1 year ago Paresthesia of right lower extremity   [place] [facility] [place], Salvadore Oxford, NP   1 year ago Stage 3a chronic kidney disease [facility])   [facility] [place], Salvadore Oxford, [place]

## 2023-05-08 IMAGING — DX DG ANKLE COMPLETE 3+V*R*
3 series · 3 of 3 positions shown · non-contrast
Comparison: None.

CLINICAL DATA: Right ankle weakness and paresthesia.

EXAM:
RIGHT ANKLE - COMPLETE 3+ VIEW

[ankle ap]
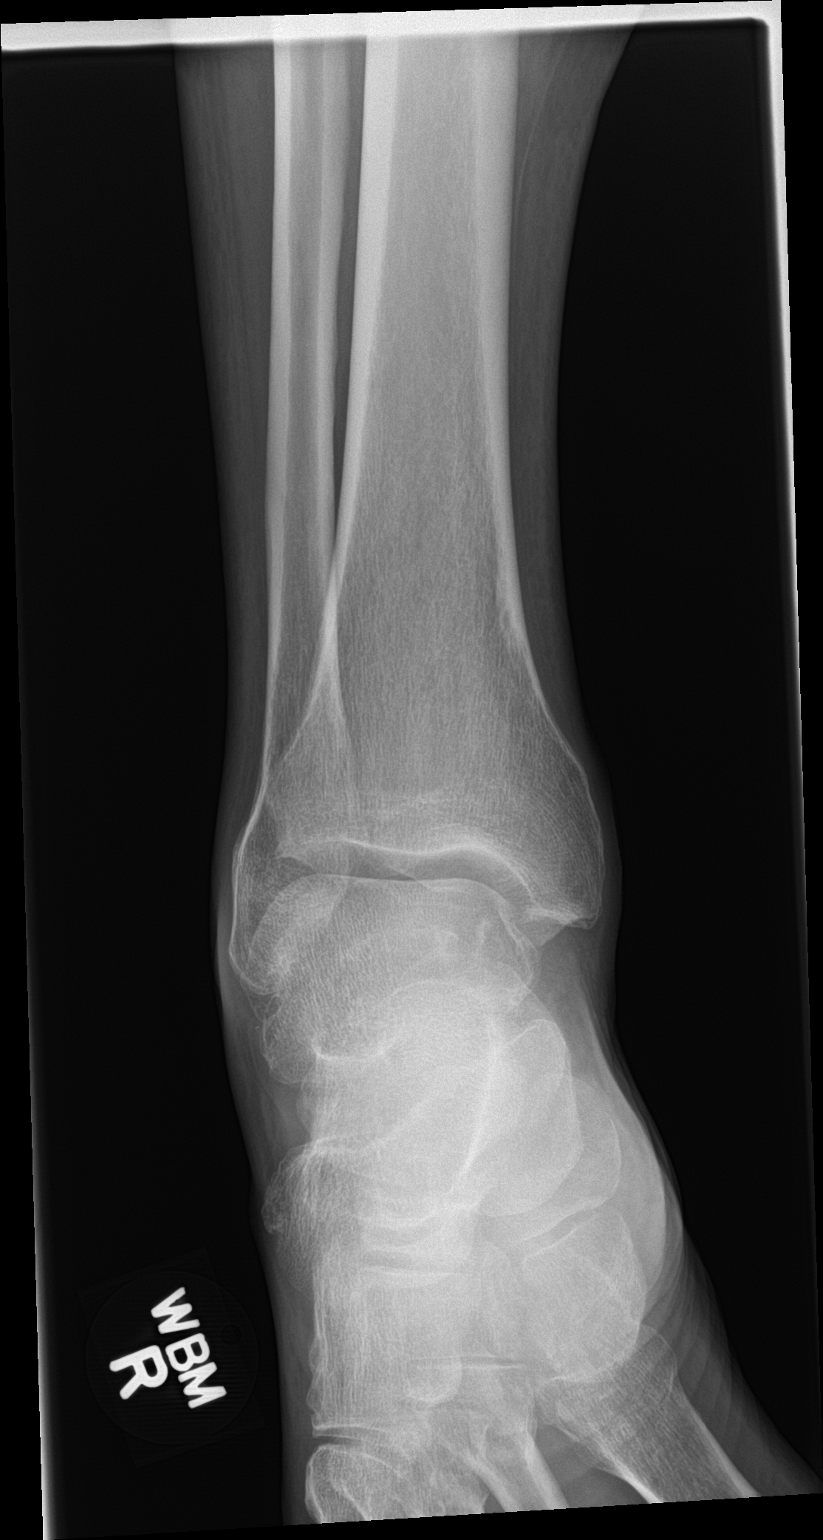

[ankle obl]
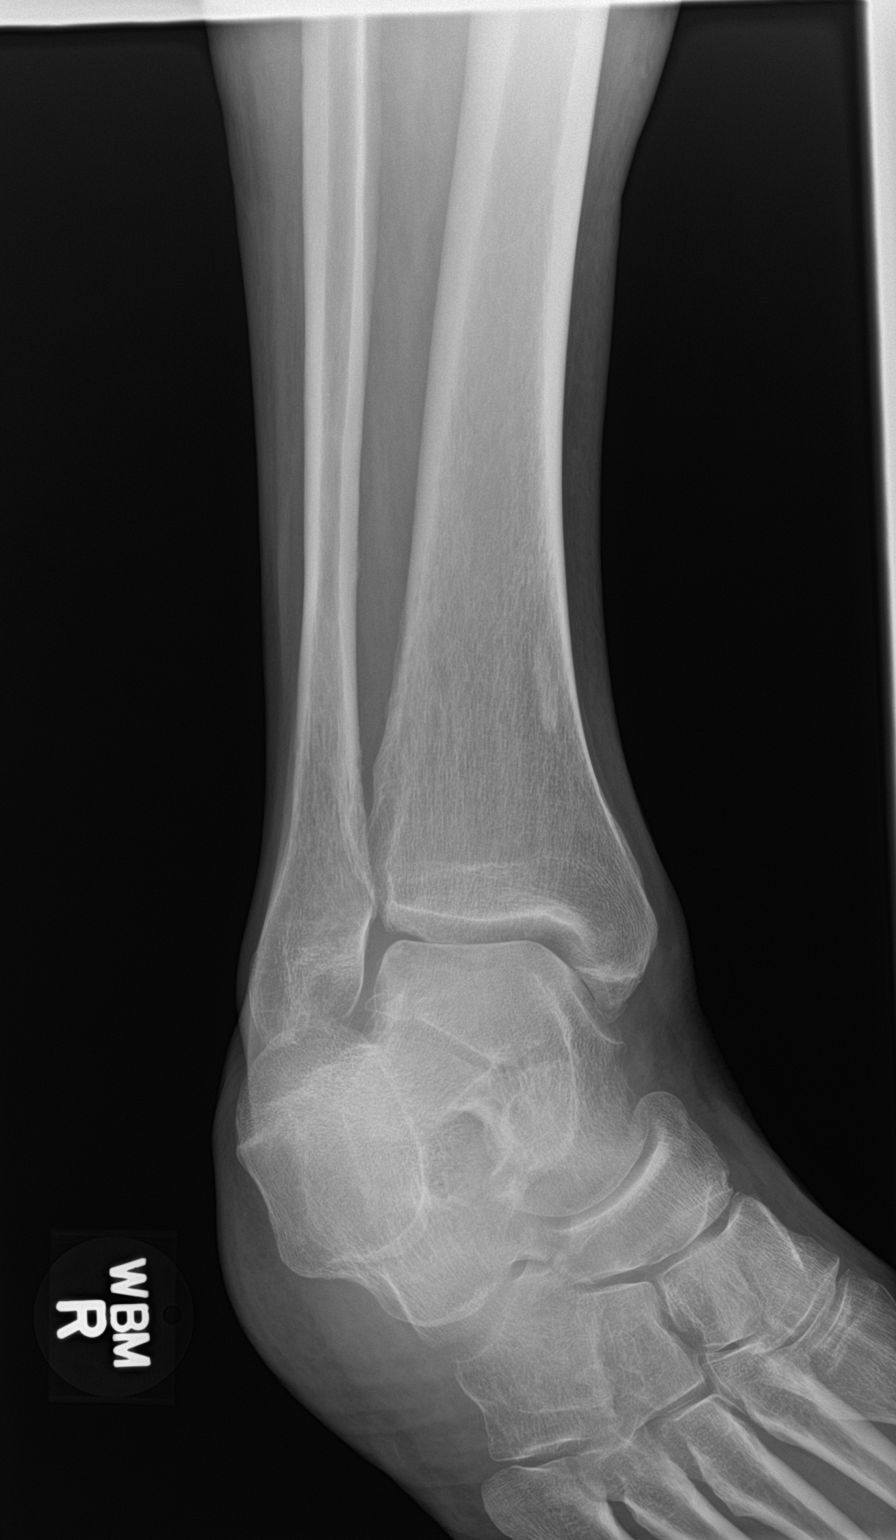

[ankle lat]
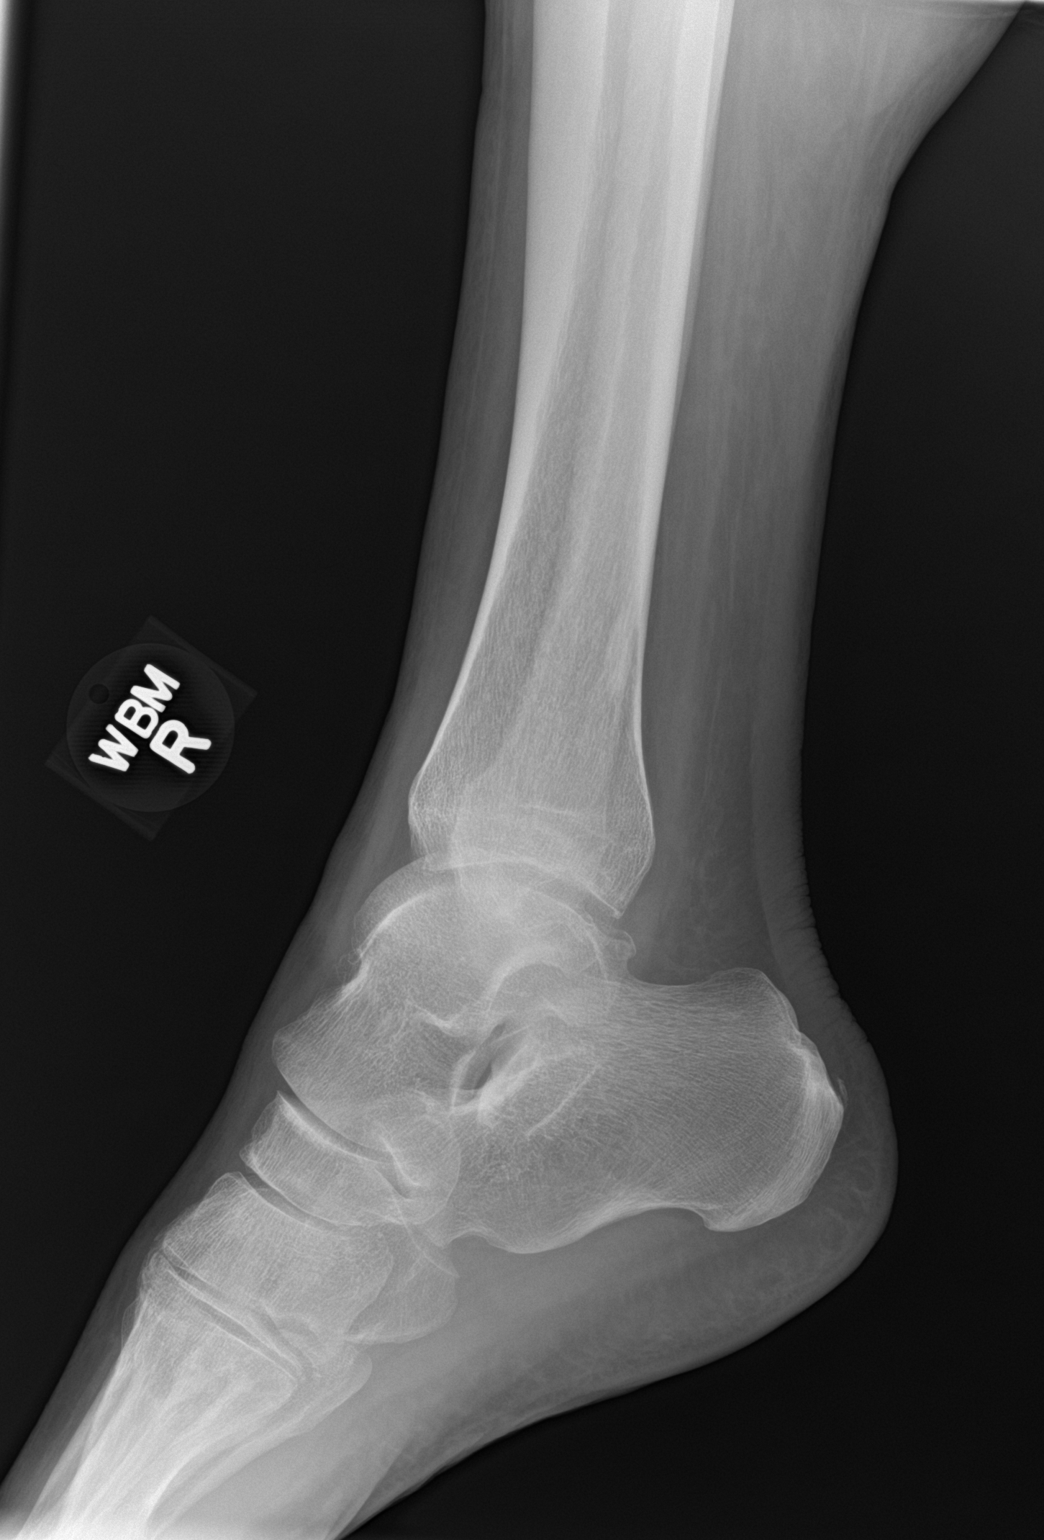

[3 of 3 positions shown; findings below may reference images not displayed]

FINDINGS: Mildly decreased bone mineralization. Mild distal medial malleolar
degenerative osteophytosis. Mild posterior and anterior aspect of
the talar dome degenerative osteophytosis on lateral view. No acute
fracture is seen. No dislocation. Mild sclerosis within the distal
posteromedial tibial metadiaphysis compatible with a benign bone
island.
IMPRESSION: Very mild medial ankle degenerative change.

## 2023-05-15 DIAGNOSIS — J00 Acute nasopharyngitis [common cold]: Secondary | ICD-10-CM | POA: Diagnosis not present

## 2023-05-15 DIAGNOSIS — U071 COVID-19: Secondary | ICD-10-CM | POA: Diagnosis not present

## 2023-06-06 ENCOUNTER — Other Ambulatory Visit: Payer: Self-pay | Admitting: Internal Medicine

## 2023-06-06 ENCOUNTER — Ambulatory Visit (INDEPENDENT_AMBULATORY_CARE_PROVIDER_SITE_OTHER): Payer: BC Managed Care – PPO

## 2023-06-06 DIAGNOSIS — Z0000 Encounter for general adult medical examination without abnormal findings: Secondary | ICD-10-CM | POA: Diagnosis not present

## 2023-06-06 DIAGNOSIS — Z79899 Other long term (current) drug therapy: Secondary | ICD-10-CM

## 2023-06-06 MED ORDER — HYDROCODONE-ACETAMINOPHEN 10-325 MG PO TABS: 1.0000 | ORAL_TABLET | Freq: Every day | ORAL | 0 refills | Status: DC | PRN

## 2023-06-06 NOTE — Patient Instructions (Addendum)
[name] , Thank you for taking time to come for your Medicare Wellness Visit. I appreciate your ongoing commitment to your health goals. Please review the following plan we discussed and let me know if I can assist you in the future.   Referrals/Orders/Follow-Ups/Clinician Recommendations: none  This is a list of the screening recommended for you and due dates:  Health Maintenance  Topic Date Due   DTaP/Tdap/Td vaccine (1 - Tdap) Never done   Zoster (Shingles) Vaccine (1 of 2) Never done   Colon Cancer Screening  Never done   COVID-19 Vaccine (4 - 2023-24 season) 06/14/2022   Flu Shot  05/15/2023   Pneumonia Vaccine (1 of 1 - PCV) 10/30/2023*   Hepatitis C Screening  Addressed   HPV Vaccine  Aged Out  *Topic was postponed. The date shown is not the original due date.    Advanced directives: (ACP Link)Information on Advanced Care Planning can be found at [facility] of [facility] Directives Advance Health Care Directives ([email or web address]   Next Medicare Annual Wellness Visit scheduled for next year: Yes     06/11/24 @ 11:00 am by phone

## 2023-06-06 NOTE — Progress Notes (Signed)
Subjective:   Thomas Lamb is a 72 y.o. male who presents for Medicare Annual/Subsequent preventive examination.  Visit Complete: Virtual  I connected with  Marisa Cyphers Zile on 06/06/23 by a audio enabled telemedicine application and verified that I am speaking with the correct person using two identifiers.  Patient Location: Home  Provider Location: Office/Clinic  I discussed the limitations of evaluation and management by telemedicine. The patient expressed understanding and agreed to proceed.  Vital Signs: Unable to obtain new vitals due to this being a telehealth visit.  Review of Systems     Cardiac Risk Factors include: advanced age (>87men, >58 women);dyslipidemia;male gender;obesity (BMI >30kg/m2)     Objective:    There were no vitals filed for this visit. There is no height or weight on file to calculate BMI.     06/06/2023   11:01 AM 05/31/2022   11:08 AM 01/17/2019   10:37 AM  Advanced Directives  Does Patient Have a Medical Advance Directive? No No No  Would patient like information on creating a medical advance directive? No - Patient declined No - Patient declined No - Patient declined    Current Medications (verified) Outpatient Encounter Medications as of 06/06/2023  Medication Sig   Glucosa-Chondr-Na Chondr-MSM [phone] MG TABS Take 1 tablet by mouth daily.   HYDROcodone-acetaminophen (NORCO) 10-325 MG tablet Take 1 tablet by mouth daily as needed.   Multiple Vitamin (MULTIVITAMIN) tablet Take 1 tablet by mouth daily.   No facility-administered encounter medications on file as of 06/06/2023.    Allergies (verified) Penicillins   History: Past Medical History:  Diagnosis Date   Chicken pox    Past Surgical History:  Procedure Laterality Date   APPENDECTOMY  2007   BACK SURGERY  [phone]   KNEE SURGERY Left 1966   calcium removal   TONSILLECTOMY AND ADENOIDECTOMY  1957   Family History  Problem Relation Age of Onset   Arthritis  Mother    Arthritis Father    Stroke Father    Hypertension Father    Cancer Neg Hx    Diabetes Neg Hx    Early death Neg Hx    Social History   Socioeconomic History   Marital status: Married    Spouse name: Not on file   Number of children: Not on file   Years of education: Not on file   Highest education level: Not on file  Occupational History   Not on file  Tobacco Use   Smoking status: Former    Current packs/day: 0.00    Types: Cigarettes    Quit date: 10/14/2002    Years since quitting: 20.6   Smokeless tobacco: Never  Vaping Use   Vaping status: Never Used  Substance and Sexual Activity   Alcohol use: Yes    Alcohol/week: 2.0 standard drinks of alcohol    Types: 2 Cans of beer per week    Comment: occasional   Drug use: No   Sexual activity: Not on file  Other Topics Concern   Not on file  Social History Narrative   Not on file   Social Determinants of Health   Financial Resource Strain: Low Risk  (06/06/2023)   Overall Financial Resource Strain (CARDIA)    Difficulty of Paying Living Expenses: Not very hard  Food Insecurity: No Food Insecurity (06/06/2023)   Hunger Vital Sign    Worried About Running Out of Food in the Last Year: Never true    Ran Out of Food  in the Last Year: Never true  Transportation Needs: No Transportation Needs (06/06/2023)   PRAPARE - Administrator, Civil Service (Medical): No    Lack of Transportation (Non-Medical): No  Physical Activity: Sufficiently Active (06/06/2023)   Exercise Vital Sign    Days of Exercise per Week: 3 days    Minutes of Exercise per Session: 60 min  Stress: No Stress Concern Present (06/06/2023)   [employer] of Occupational Health - Occupational Stress Questionnaire    Feeling of Stress : Only a little  Social Connections: Moderately Isolated (06/06/2023)   Social Connection and Isolation Panel [NHANES]    Frequency of Communication with Friends and Family: More than three times a week     Frequency of Social Gatherings with Friends and Family: Not on file    Attends Religious Services: Never    Database administrator or Organizations: No    Attends Engineer, structural: Never    Marital Status: Married    Tobacco Counseling Counseling given: Not Answered   Clinical Intake:  Pre-visit preparation completed: Yes  Pain : No/denies pain     Nutritional Risks: None Diabetes: No  How often do you need to have someone help you when you read instructions, pamphlets, or other written materials from your doctor or pharmacy?: 1 - Never  Interpreter Needed?: No  Information entered by :: Kennedy Bucker, LPN   Activities of Daily Living    06/06/2023   11:01 AM 10/29/2022    9:16 AM  In your present state of health, do you have any difficulty performing the following activities:  Hearing? 0 0  Vision? 0 0  Difficulty concentrating or making decisions? 0 0  Walking or climbing stairs? 0 0  Dressing or bathing? 0 0  Doing errands, shopping? 0 0  Preparing Food and eating ? N   Using the Toilet? N   In the past six months, have you accidently leaked urine? N   Do you have problems with loss of bowel control? N   Managing your Medications? N   Managing your Finances? N   Housekeeping or managing your Housekeeping? N     Patient Care Team: Lorre Munroe, NP as PCP - General (Internal Medicine)  Indicate any recent Medical Services you may have received from other than [facility] providers in the past year (date may be approximate).     Assessment:   This is a routine wellness examination for Covin.  Hearing/Vision screen Hearing Screening - Comments:: No aids Vision Screening - Comments:: Readers-Dr.Miller in [place]  Dietary issues and exercise activities discussed:     Goals Addressed             This Visit's Progress    DIET - INCREASE WATER INTAKE         Depression Screen    06/06/2023   10:59 AM 10/29/2022    9:16 AM 05/31/2022    11:06 AM 11/02/2021    2:44 PM 04/24/2021    8:40 AM 06/15/2020    9:37 AM 05/11/2019   10:40 AM  PHQ 2/9 Scores  PHQ - 2 Score 0 0 0 0 0 0 0  PHQ- 9 Score 0  0 2 0  0    Fall Risk    06/06/2023   11:01 AM 10/29/2022    9:16 AM 05/31/2022   11:09 AM 11/02/2021    2:44 PM 04/24/2021    8:41 AM  Fall Risk   Falls  in the past year? 0 0 0 0 1  Number falls in past yr: 0  0 0 1  Injury with Fall? 0 0 0 0 0  Risk for fall due to : No Fall Risks  No Fall Risks No Fall Risks Impaired balance/gait  Follow up Falls prevention discussed;Falls evaluation completed  Falls evaluation completed Falls evaluation completed Falls evaluation completed    MEDICARE RISK AT HOME: Medicare Risk at Home Any stairs in or around the home?: Yes If so, are there any without handrails?: No Home free of loose throw rugs in walkways, pet beds, electrical cords, etc?: Yes Adequate lighting in your home to reduce risk of falls?: Yes Life alert?: No Use of a cane, walker or w/c?: No Grab bars in the bathroom?: Yes Shower chair or bench in shower?: Yes Elevated toilet seat or a handicapped toilet?: No  TIMED UP AND GO:  Was the test performed?  No    Cognitive Function:        06/06/2023   11:03 AM 05/31/2022   11:10 AM  6CIT Screen  What Year? 0 points 0 points  What month? 0 points 0 points  What time? 0 points 0 points  Count back from 20 0 points 0 points  Months in reverse 0 points 0 points  Repeat phrase 0 points 0 points  Total Score 0 points 0 points    Immunizations Immunization History  Administered Date(s) Administered   PFIZER(Purple Top)SARS-COV-2 Vaccination 01/04/2020, 01/26/2020, 08/11/2020    TDAP status: Due, Education has been provided regarding the importance of this vaccine. Advised may receive this vaccine at local pharmacy or Health Dept. Aware to provide a copy of the vaccination record if obtained from local pharmacy or Health Dept. Verbalized acceptance and  understanding.  Flu Vaccine status: Declined, Education has been provided regarding the importance of this vaccine but patient still declined. Advised may receive this vaccine at local pharmacy or Health Dept. Aware to provide a copy of the vaccination record if obtained from local pharmacy or Health Dept. Verbalized acceptance and understanding.  Pneumococcal vaccine status: Declined,  Education has been provided regarding the importance of this vaccine but patient still declined. Advised may receive this vaccine at local pharmacy or Health Dept. Aware to provide a copy of the vaccination record if obtained from local pharmacy or Health Dept. Verbalized acceptance and understanding.   Covid-19 vaccine status: Completed vaccines  Qualifies for Shingles Vaccine? Yes   Zostavax completed No   Shingrix Completed?: No.    Education has been provided regarding the importance of this vaccine. Patient has been advised to call insurance company to determine out of pocket expense if they have not yet received this vaccine. Advised may also receive vaccine at local pharmacy or Health Dept. Verbalized acceptance and understanding.  Screening Tests Health Maintenance  Topic Date Due   DTaP/Tdap/Td (1 - Tdap) Never done   Zoster Vaccines- Shingrix (1 of 2) Never done   Colonoscopy  Never done   COVID-19 Vaccine (4 - 2023-24 season) 06/14/2022   INFLUENZA VACCINE  05/15/2023   Pneumonia Vaccine 29+ Years old (1 of 1 - PCV) 10/30/2023 (Originally 05/13/2015)   Hepatitis C Screening  Addressed   HPV VACCINES  Aged Out    Health Maintenance  Health Maintenance Due  Topic Date Due   DTaP/Tdap/Td (1 - Tdap) Never done   Zoster Vaccines- Shingrix (1 of 2) Never done   Colonoscopy  Never done   COVID-19 Vaccine (  4 - 2023-24 season) 06/14/2022   INFLUENZA VACCINE  05/15/2023    Declined referral for colonoscopy  Lung Cancer Screening: (Low Dose CT Chest recommended if Age 41-80 years, 20 pack-year  currently smoking OR have quit w/in 15years.) does not qualify.    Additional Screening:  Hepatitis C Screening: does qualify; Completed 07/15/16  Vision Screening: Recommended annual ophthalmology exams for early detection of glaucoma and other disorders of the eye. Is the patient up to date with their annual eye exam?  Yes  Who is the provider or what is the name of the office in which the patient attends annual eye exams? Dr.Miller in [place] If pt is not established with a provider, would they like to be referred to a provider to establish care? No .   Dental Screening: Recommended annual dental exams for proper oral hygiene   Community Resource Referral / Chronic Care Management: CRR required this visit?  No   CCM required this visit?  No     Plan:     I have personally reviewed and noted the following in the patient's chart:   Medical and social history Use of alcohol, tobacco or illicit drugs  Current medications and supplements including opioid prescriptions. Patient is not currently taking opioid prescriptions. Functional ability and status Nutritional status Physical activity Advanced directives List of other physicians Hospitalizations, surgeries, and ER visits in previous 12 months Vitals Screenings to include cognitive, depression, and falls Referrals and appointments  In addition, I have reviewed and discussed with patient certain preventive protocols, quality metrics, and best practice recommendations. A written personalized care plan for preventive services as well as general preventive health recommendations were provided to patient.     Hal Hope, LPN   3/47/4259   After Visit Summary: (MyChart) Due to this being a telephonic visit, the after visit summary with patients personalized plan was offered to patient via MyChart   Nurse Notes: none

## 2023-08-01 ENCOUNTER — Other Ambulatory Visit: Payer: Self-pay | Admitting: Internal Medicine

## 2023-08-01 DIAGNOSIS — Z79899 Other long term (current) drug therapy: Secondary | ICD-10-CM

## 2023-08-01 MED ORDER — HYDROCODONE-ACETAMINOPHEN 10-325 MG PO TABS: 1.0000 | ORAL_TABLET | Freq: Every day | ORAL | 0 refills | Status: DC | PRN

## 2023-08-01 NOTE — Telephone Encounter (Signed)
Requested medications are due for refill today.  yes  Requested medications are on the active medications list.  yes  Last refill. 06/06/2023 #30 0 rf  Future visit scheduled.   no  Notes to clinic.  Refill/refusal not delegated.    Requested Prescriptions  Pending Prescriptions Disp Refills   HYDROcodone-acetaminophen (NORCO) 10-325 MG tablet 30 tablet 0    Sig: Take 1 tablet by mouth daily as needed.     Not Delegated - Analgesics:  Opioid Agonist Combinations Failed - 08/01/2023  1:05 PM      Failed - This refill cannot be delegated      Failed - Urine Drug Screen completed in last 360 days      Passed - Valid encounter within last 3 months    Recent Outpatient Visits           9 months ago Encounter for general adult medical examination with abnormal findings   [place] [facility] [place], Salvadore Oxford, NP   1 year ago Paresthesia of right lower extremity   [place] [facility] [place], Salvadore Oxford, NP   2 years ago Stage 3a chronic kidney disease [facility])   [place] [facility] [place], Salvadore Oxford, NP

## 2023-08-01 NOTE — Telephone Encounter (Signed)
Medication Refill - Medication: HYDROcodone-acetaminophen (NORCO) 10-325 MG tablet [[id number]]   Has the patient contacted their pharmacy? Yes.     (Agent: If yes, when and what did the pharmacy advise?) Contact PCP   Preferred Pharmacy (with phone number or street name): [facility] [address]   Has the patient been seen for an appointment in the last year OR does the patient have an upcoming appointment? Yes.    Agent: Please be advised that RX refills may take up to 3 business days. We ask that you follow-up with your pharmacy.

## 2023-09-29 ENCOUNTER — Other Ambulatory Visit: Payer: Self-pay | Admitting: Internal Medicine

## 2023-09-29 DIAGNOSIS — Z79899 Other long term (current) drug therapy: Secondary | ICD-10-CM

## 2023-09-29 NOTE — Telephone Encounter (Signed)
Medication Refill -  Most Recent Primary Care Visit:  Provider: Hal Hope  Department: [facility]  Visit Type: MEDICARE AWV, SEQUENTIAL  Date: 06/06/2023  Medication: Hydrocodone 10/375  Has the patient contacted their pharmacy? No (Agent: If no, request that the patient contact the pharmacy for the refill. If patient does not wish to contact the pharmacy document the reason why and proceed with request.) (Agent: If yes, when and what did the pharmacy advise?)  Is this the correct pharmacy for this prescription? Yes If no, delete pharmacy and type the correct one.  This is the patient's preferred pharmacy:  [id number] [place], [place] - [address] AT [facility] OF [address] [place] [address] Phone: [phone] Fax: [phone]   Has the prescription been filled recently? Yes  Is the patient out of the medication? No  Has the patient been seen for an appointment in the last year OR does the patient have an upcoming appointment? Yes  Can we respond through MyChart? Yes  Agent: Please be advised that Rx refills may take up to 3 business days. We ask that you follow-up with your pharmacy.

## 2023-09-30 MED ORDER — HYDROCODONE-ACETAMINOPHEN 10-325 MG PO TABS: 1.0000 | ORAL_TABLET | Freq: Every day | ORAL | 0 refills | Status: DC | PRN

## 2023-09-30 NOTE — Telephone Encounter (Signed)
Requested medications are due for refill today.  yes  Requested medications are on the active medications list.  yes  Last refill. 08/01/2023 #30 0 rf  Future visit scheduled.   no  Notes to clinic.  Refill not delegated.    Requested Prescriptions  Pending Prescriptions Disp Refills   HYDROcodone-acetaminophen (NORCO) 10-325 MG tablet 30 tablet 0    Sig: Take 1 tablet by mouth daily as needed.     Not Delegated - Analgesics:  Opioid Agonist Combinations Failed - 09/30/2023 12:57 PM      Failed - This refill cannot be delegated      Failed - Urine Drug Screen completed in last 360 days      Failed - Valid encounter within last 3 months    Recent Outpatient Visits           11 months ago Encounter for general adult medical examination with abnormal findings   [place] [facility] [place], Salvadore Oxford, NP   1 year ago Paresthesia of right lower extremity   [place] [facility] [place], Salvadore Oxford, NP   2 years ago Stage 3a chronic kidney disease [facility])   [place] [facility] [place], Salvadore Oxford, [place]

## 2023-11-18 ENCOUNTER — Other Ambulatory Visit: Payer: Self-pay | Admitting: Internal Medicine

## 2023-11-18 DIAGNOSIS — Z79899 Other long term (current) drug therapy: Secondary | ICD-10-CM

## 2023-11-18 NOTE — Telephone Encounter (Signed)
 Medication Refill -  Most Recent Primary Care Visit:  Provider: GWENN JHONNIE RAMAN  Department: [facility]  Visit Type: MEDICARE AWV, SEQUENTIAL  Date: 06/06/2023  Medication: HYDROcodone -acetaminophen  (NORCO) 10-325 MG tablet   Has the patient contacted their pharmacy? No, because is controlled substance  Is this the correct pharmacy for this prescription? yes This is the patient's preferred pharmacy:  [id number] [place], [place] - [address] AT [facility] OF [address] [place] [address] Phone: [phone] Fax: [phone]   Has the prescription been filled recently? no  Is the patient out of the medication? yes  Has the patient been seen for an appointment in the last year OR does the patient have an upcoming appointment? Yes   Can we respond through MyChart? yes  Agent: Please be advised that Rx refills may take up to 3 business days. We ask that you follow-up with your pharmacy.

## 2023-11-19 NOTE — Telephone Encounter (Signed)
 Requested medication (s) are due for refill today - yes  Requested medication (s) are on the active medication list -yes  Future visit scheduled -yes  Last refill: 09/30/23 #30  Notes to clinic: non delegated Rx  Requested Prescriptions  Pending Prescriptions Disp Refills   HYDROcodone -acetaminophen  (NORCO) 10-325 MG tablet 30 tablet 0    Sig: Take 1 tablet by mouth daily as needed.     Not Delegated - Analgesics:  Opioid Agonist Combinations Failed - 11/19/2023  8:53 AM      Failed - This refill cannot be delegated      Failed - Urine Drug Screen completed in last 360 days      Failed - Valid encounter within last 3 months    Recent Outpatient Visits           1 year ago Encounter for general adult medical examination with abnormal findings   [place] [facility] [place], Angeline ORN, NP   2 years ago Paresthesia of right lower extremity   [place] [facility] [place], Angeline ORN, NP   2 years ago Stage 3a chronic kidney disease [facility])   [place] [facility] [place], Angeline ORN, NP       Future Appointments             [place], Angeline ORN, NP [facility]               Requested Prescriptions  Pending Prescriptions Disp Refills   HYDROcodone -acetaminophen  (NORCO) 10-325 MG tablet 30 tablet 0    Sig: Take 1 tablet by mouth daily as needed.     Not Delegated - Analgesics:  Opioid Agonist Combinations Failed - 11/19/2023  8:53 AM      Failed - This refill cannot be delegated      Failed - Urine Drug Screen completed in last 360 days      Failed - Valid encounter within last 3 months    Recent Outpatient Visits           1 year ago Encounter for general adult medical examination with abnormal findings   [place] [facility] [place], Angeline ORN, NP   2 years ago Paresthesia of right lower extremity   [place] [facility] [place], Angeline ORN, NP   2  years ago Stage 3a chronic kidney disease [facility])   [place] [facility] [place], Angeline ORN, NP       Future Appointments             Tomorrow Antonette, Angeline ORN, NP [facility], [facility]

## 2023-11-20 ENCOUNTER — Encounter: Payer: Self-pay | Admitting: Internal Medicine

## 2023-11-20 ENCOUNTER — Ambulatory Visit: Payer: BC Managed Care – PPO | Admitting: Internal Medicine

## 2023-11-20 VITALS — BP 132/78 | Ht 72.0 in | Wt 240.8 lb

## 2023-11-20 DIAGNOSIS — M545 Low back pain, unspecified: Secondary | ICD-10-CM

## 2023-11-20 DIAGNOSIS — E782 Mixed hyperlipidemia: Secondary | ICD-10-CM

## 2023-11-20 DIAGNOSIS — E6609 Other obesity due to excess calories: Secondary | ICD-10-CM

## 2023-11-20 DIAGNOSIS — D75839 Thrombocytosis, unspecified: Secondary | ICD-10-CM | POA: Diagnosis not present

## 2023-11-20 DIAGNOSIS — R739 Hyperglycemia, unspecified: Secondary | ICD-10-CM | POA: Diagnosis not present

## 2023-11-20 DIAGNOSIS — Z6832 Body mass index (BMI) 32.0-32.9, adult: Secondary | ICD-10-CM

## 2023-11-20 DIAGNOSIS — N1831 Chronic kidney disease, stage 3a: Secondary | ICD-10-CM | POA: Diagnosis not present

## 2023-11-20 DIAGNOSIS — G8929 Other chronic pain: Secondary | ICD-10-CM

## 2023-11-20 DIAGNOSIS — E66811 Obesity, class 1: Secondary | ICD-10-CM

## 2023-11-20 MED ORDER — HYDROCODONE-ACETAMINOPHEN 10-325 MG PO TABS: 1.0000 | ORAL_TABLET | Freq: Every day | ORAL | 0 refills | Status: DC | PRN

## 2023-11-20 NOTE — Assessment & Plan Note (Signed)
 C-Met and lipid profile today Encouraged him to consume a low-fat diet

## 2023-11-20 NOTE — Assessment & Plan Note (Signed)
 CMET today Encourage adequate water intake

## 2023-11-20 NOTE — Assessment & Plan Note (Signed)
 CBC today.

## 2023-11-20 NOTE — Assessment & Plan Note (Signed)
Managed with hydrocodone as needed

## 2023-11-20 NOTE — Progress Notes (Signed)
 Subjective:    Patient ID: Thomas Lamb, male    DOB: 01/24/1951, 73 y.o.   MRN: [id number]  HPI  Patient presents the clinic today for follow-up of chronic  conditions.  CKD: His last creatinine was 1.29, GFR 59, 10/2022. He is not on an ACEI/ARB. He does not follow with nephrology.  Hypertriglyceridemia: His last LDL was 78, triglycerides 843, 1 01/2024. He is not taking any cholesterol lowering medication. He tries to consume a low fat diet.  Thrombocytosis: His last platelet count was 366, 10/2022. He does not follow with hematology.  Chronic pain: Mainly in his back.  He is taking hydrocodone  as prescribed.  He does not follow with orthopedics or pain management.   Review of Systems  Past Medical History:  Diagnosis Date   Chicken pox     Current Outpatient Medications  Medication Sig Dispense Refill   Glucosa-Chondr-Na Chondr-MSM 500-400-422-83 MG TABS Take 1 tablet by mouth daily.     HYDROcodone -acetaminophen  (NORCO) 10-325 MG tablet Take 1 tablet by mouth daily as needed. 30 tablet 0   Multiple Vitamin (MULTIVITAMIN) tablet Take 1 tablet by mouth daily.     No current facility-administered medications for this visit.    Allergies  Allergen Reactions   Penicillins Hives    Family History  Problem Relation Age of Onset   Arthritis Mother    Arthritis Father    Stroke Father    Hypertension Father    Cancer Neg Hx    Diabetes Neg Hx    Early death Neg Hx     Social History   Socioeconomic History   Marital status: Married    Spouse name: Not on file   Number of children: Not on file   Years of education: Not on file   Highest education level: Not on file  Occupational History   Not on file  Tobacco Use   Smoking status: Former    Current packs/day: 0.00    Types: Cigarettes    Quit date: 10/14/2002    Years since quitting: 21.1   Smokeless tobacco: Never  Vaping Use   Vaping status: Never Used  Substance and Sexual Activity   Alcohol use:  Yes    Alcohol/week: 2.0 standard drinks of alcohol    Types: 2 Cans of beer per week    Comment: occasional   Drug use: No   Sexual activity: Not on file  Other Topics Concern   Not on file  Social History Narrative   Not on file   Social Drivers of Health   Financial Resource Strain: Low Risk  (06/06/2023)   Overall Financial Resource Strain (CARDIA)    Difficulty of Paying Living Expenses: Not very hard  Food Insecurity: No Food Insecurity (06/06/2023)   Hunger Vital Sign    Worried About Running Out of Food in the Last Year: Never true    Ran Out of Food in the Last Year: Never true  Transportation Needs: No Transportation Needs (06/06/2023)   PRAPARE - Administrator, Civil Service (Medical): No    Lack of Transportation (Non-Medical): No  Physical Activity: Sufficiently Active (06/06/2023)   Exercise Vital Sign    Days of Exercise per Week: 3 days    Minutes of Exercise per Session: 60 min  Stress: No Stress Concern Present (06/06/2023)   [employer] of Occupational Health - Occupational Stress Questionnaire    Feeling of Stress : Only a little  Social Connections: Moderately Isolated (06/06/2023)  Social Advertising Account Executive [NHANES]    Frequency of Communication with Friends and Family: More than three times a week    Frequency of Social Gatherings with Friends and Family: Not on file    Attends Religious Services: Never    Database Administrator or Organizations: No    Attends Banker Meetings: Never    Marital Status: Married  Catering Manager Violence: Not At Risk (06/06/2023)   Humiliation, Afraid, Rape, and Kick questionnaire    Fear of Current or Ex-Partner: No    Emotionally Abused: No    Physically Abused: No    Sexually Abused: No     Constitutional: Denies fever, malaise, fatigue, headache or abrupt weight changes.  HEENT: Denies eye pain, eye redness, ear pain, ringing in the ears, wax buildup, runny nose, nasal  congestion, bloody nose, or sore throat. Respiratory: Denies difficulty breathing, shortness of breath, cough or sputum production.   Cardiovascular: Denies chest pain, chest tightness, palpitations or swelling in the hands or feet.  Gastrointestinal: Denies abdominal pain, bloating, constipation, diarrhea or blood in the stool.  GU: Denies urgency, frequency, pain with urination, burning sensation, blood in urine, odor or discharge. Musculoskeletal: Patient reports chronic back pain.  Denies decrease in range of motion, difficulty with gait, muscle pain or joint swelling.  Skin: Denies redness, rashes, lesions or ulcercations.  Neurological: Denies dizziness, difficulty with memory, difficulty with speech or problems with balance and coordination.  Psych: Denies anxiety, depression, SI/HI.  No other specific complaints in a complete review of systems (except as listed in HPI above).     Objective:   Physical Exam BP 132/78 (BP Location: Left Arm, Patient Position: Sitting, Cuff Size: Large)   Ht 6' (1.829 m)   Wt 240 lb 12.8 oz (109.2 kg)   BMI 32.66 kg/m    Wt Readings from Last 3 Encounters:  10/29/22 242 lb (109.8 kg)  05/31/22 242 lb (109.8 kg)  11/02/21 242 lb (109.8 kg)    General: Appears his stated age, obese, in NAD. Skin: Warm, dry and intact.  HEENT: Head: normal shape and size; Eyes: sclera white and EOMs intact;  Cardiovascular: Normal rate and rhythm. S1,S2 noted.  No murmur, rubs or gallops noted.  Pulmonary/Chest: Normal effort and positive vesicular breath sounds. No respiratory distress. No wheezes, rales or ronchi noted.  Musculoskeletal: No difficulty with gait.  Neurological: Alert and oriented.  Psychiatric: Mood and affect normal. Behavior is normal. Judgment and thought content normal.     BMET    Component Value Date/Time   NA 141 10/29/2022 0921   K 4.4 10/29/2022 0921   CL 104 10/29/2022 0921   CO2 30 10/29/2022 0921   GLUCOSE 105 (H)  10/29/2022 0921   BUN 20 10/29/2022 0921   CREATININE 1.29 (H) 10/29/2022 0921   CALCIUM 9.7 10/29/2022 0921    Lipid Panel     Component Value Date/Time   CHOL 140 10/29/2022 0921   TRIG 156 (H) 10/29/2022 0921   HDL 37 (L) 10/29/2022 0921   CHOLHDL 3.8 10/29/2022 0921   VLDL 39.8 06/15/2020 0954   LDLCALC 78 10/29/2022 0921    CBC    Component Value Date/Time   WBC 5.8 10/29/2022 0921   RBC 4.74 10/29/2022 0921   HGB 14.5 10/29/2022 0921   HCT 42.2 10/29/2022 0921   PLT 366 10/29/2022 0921   MCV 89.0 10/29/2022 0921   MCH 30.6 10/29/2022 0921   MCHC 34.4 10/29/2022 0921  RDW 13.7 10/29/2022 0921    Hgb A1C Lab Results  Component Value Date   HGBA1C 5.4 10/29/2022            Assessment & Plan:   RTC in 6 months for your annual exam Angeline Laura, NP

## 2023-11-20 NOTE — Assessment & Plan Note (Signed)
 Encourage diet and exercise for weight loss

## 2023-11-20 NOTE — Patient Instructions (Signed)

## 2023-11-21 LAB — CBC
HCT: 45.5 % (ref 38.5–50.0)
Hemoglobin: 15 g/dL (ref 13.2–17.1)
MCH: 29.8 pg (ref 27.0–33.0)
MCHC: 33 g/dL (ref 32.0–36.0)
MCV: 90.3 fL (ref 80.0–100.0)
MPV: 9.1 fL (ref 7.5–12.5)
Platelets: 418 10*3/uL — ABNORMAL HIGH (ref 140–400)
RBC: 5.04 10*6/uL (ref 4.20–5.80)
RDW: 13.6 % (ref 11.0–15.0)
WBC: 6.4 10*3/uL (ref 3.8–10.8)

## 2023-11-21 LAB — HEMOGLOBIN A1C
Hgb A1c MFr Bld: 5.4 %{Hb} (ref <5.7)
Mean Plasma Glucose: 108 mg/dL
eAG (mmol/L): 6 mmol/L

## 2023-11-21 LAB — COMPLETE METABOLIC PANEL WITH GFR
AG Ratio: 1.9 (calc) (ref 1.0–2.5)
ALT: 25 U/L (ref 9–46)
AST: 20 U/L (ref 10–35)
Albumin: 4.7 g/dL (ref 3.6–5.1)
Alkaline phosphatase (APISO): 61 U/L (ref 35–144)
BUN/Creatinine Ratio: 17 (calc) (ref 6–22)
BUN: 22 mg/dL (ref 7–25)
CO2: 31 mmol/L (ref 20–32)
Calcium: 9.8 mg/dL (ref 8.6–10.3)
Chloride: 102 mmol/L (ref 98–110)
Creat: 1.29 mg/dL — ABNORMAL HIGH (ref 0.70–1.28)
Globulin: 2.5 g/dL (ref 1.9–3.7)
Glucose, Bld: 98 mg/dL (ref 65–99)
Potassium: 4.3 mmol/L (ref 3.5–5.3)
Sodium: 140 mmol/L (ref 135–146)
Total Bilirubin: 0.5 mg/dL (ref 0.2–1.2)
Total Protein: 7.2 g/dL (ref 6.1–8.1)
eGFR: 59 mL/min/{1.73_m2} — ABNORMAL LOW (ref >=60)

## 2023-11-21 LAB — LIPID PANEL
Cholesterol: 179 mg/dL (ref <200)
HDL: 41 mg/dL (ref >=40)
LDL Cholesterol (Calc): 107 mg/dL — ABNORMAL HIGH
Non-HDL Cholesterol (Calc): 138 mg/dL — ABNORMAL HIGH (ref <130)
Total CHOL/HDL Ratio: 4.4 (calc) (ref <5.0)
Triglycerides: 190 mg/dL — ABNORMAL HIGH (ref <150)

## 2024-01-08 ENCOUNTER — Other Ambulatory Visit: Payer: Self-pay | Admitting: Internal Medicine

## 2024-01-08 DIAGNOSIS — Z79899 Other long term (current) drug therapy: Secondary | ICD-10-CM

## 2024-01-08 NOTE — Telephone Encounter (Signed)
 Copied from CRM [id number]. Topic: Clinical - Medication Refill >> Jan 08, 2024  8:38 AM Izetta Dakin wrote: Most Recent Primary Care Visit:  Provider: Lorre Munroe  Department: [facility]  Visit Type: OFFICE VISIT  Date: 11/20/2023  Medication: HYDROcodone-acetaminophen (NORCO) 10-325 MG tablet   Has the patient contacted their pharmacy? Yes (Agent: If no, request that the patient contact the pharmacy for the refill. If patient does not wish to contact the pharmacy document the reason why and proceed with request.) (Agent: If yes, when and what did the pharmacy advise?)  Is this the correct pharmacy for this prescription? Yes If no, delete pharmacy and type the correct one.  This is the patient's preferred pharmacy:  [facility] #[id number] [place], [place] - [address] AT [facility] OF [address] & [address] [place] [address] Phone: [phone] Fax: [phone]   Has the prescription been filled recently? No  Is the patient out of the medication? Yes  Has the patient been seen for an appointment in the last year OR does the patient have an upcoming appointment? Yes  Can we respond through MyChart? No  Agent: Please be advised that Rx refills may take up to 3 business days. We ask that you follow-up with your pharmacy.

## 2024-01-09 MED ORDER — HYDROCODONE-ACETAMINOPHEN 10-325 MG PO TABS: 1.0000 | ORAL_TABLET | Freq: Every day | ORAL | 0 refills | Status: DC | PRN

## 2024-01-09 NOTE — Telephone Encounter (Signed)
 Requested medication (s) are due for refill today - yes  Requested medication (s) are on the active medication list -yes  Future visit scheduled -yes  Last refill: 11/20/23 #30  Notes to clinic: non delegated Rx  Requested Prescriptions  Pending Prescriptions Disp Refills   HYDROcodone-acetaminophen (NORCO) 10-325 MG tablet 30 tablet 0    Sig: Take 1 tablet by mouth daily as needed.     Not Delegated - Analgesics:  Opioid Agonist Combinations Failed - 01/09/2024 12:35 PM      Failed - This refill cannot be delegated      Failed - Urine Drug Screen completed in last 360 days      Passed - Valid encounter within last 3 months    Recent Outpatient Visits           1 month ago Mixed hyperlipidemia   [place] [facility] [place], Salvadore Oxford, NP       Future Appointments             In 4 months Baity, Salvadore Oxford, NP [place] [facility], [facility]               Requested Prescriptions  Pending Prescriptions Disp Refills   HYDROcodone-acetaminophen (NORCO) 10-325 MG tablet 30 tablet 0    Sig: Take 1 tablet by mouth daily as needed.     Not Delegated - Analgesics:  Opioid Agonist Combinations Failed - 01/09/2024 12:35 PM      Failed - This refill cannot be delegated      Failed - Urine Drug Screen completed in last 360 days      Passed - Valid encounter within last 3 months    Recent Outpatient Visits           1 month ago Mixed hyperlipidemia   [place] [facility] [place], Salvadore Oxford, NP       Future Appointments             In 4 months Baity, Salvadore Oxford, NP [place] [facility], [facility]

## 2024-02-16 DIAGNOSIS — H2513 Age-related nuclear cataract, bilateral: Secondary | ICD-10-CM | POA: Diagnosis not present

## 2024-02-16 DIAGNOSIS — H40033 Anatomical narrow angle, bilateral: Secondary | ICD-10-CM | POA: Diagnosis not present

## 2024-02-16 DIAGNOSIS — H53143 Visual discomfort, bilateral: Secondary | ICD-10-CM | POA: Diagnosis not present

## 2024-03-15 ENCOUNTER — Other Ambulatory Visit: Payer: Self-pay | Admitting: Internal Medicine

## 2024-03-15 DIAGNOSIS — Z79899 Other long term (current) drug therapy: Secondary | ICD-10-CM

## 2024-03-15 NOTE — Telephone Encounter (Signed)
 Copied from CRM [id number]. Topic: Clinical - Medication Refill >> Mar 15, 2024 10:56 AM Alpha Arts wrote: Medication: HYDROcodone -acetaminophen  (NORCO) 10-325 MG tablet   Has the patient contacted their pharmacy? Yes (Agent: If no, request that the patient contact the pharmacy for the refill. If patient does not wish to contact the pharmacy document the reason why and proceed with request.) (Agent: If yes, when and what did the pharmacy advise?) Will not fill without new prescription  This is the patient's preferred pharmacy:   [facility] DR[facility]ORE #[id number] [place], [place] - [address] AT [facility] OF [address] [place] [address] Phone: [phone] Fax: [phone]  Is this the correct pharmacy for this prescription? Yes If no, delete pharmacy and type the correct one.   Has the prescription been filled recently? Yes  Is the patient out of the medication? No  Has the patient been seen for an appointment in the last year OR does the patient have an upcoming appointment? Yes  Can we respond through MyChart? No  Agent: Please be advised that Rx refills may take up to 3 business days. We ask that you follow-up with your pharmacy.

## 2024-03-16 MED ORDER — HYDROCODONE-ACETAMINOPHEN 10-325 MG PO TABS: 1.0000 | ORAL_TABLET | Freq: Every day | ORAL | 0 refills | Status: DC | PRN

## 2024-03-16 NOTE — Telephone Encounter (Signed)
 Requested medication (s) are due for refill today: yes  Requested medication (s) are on the active medication list: yes  Last refill:  01/09/24 #30  Future visit scheduled: yes   Notes to clinic:   med not delegated to NT to RF   Requested Prescriptions  Pending Prescriptions Disp Refills   HYDROcodone -acetaminophen  (NORCO) 10-325 MG tablet 30 tablet 0    Sig: Take 1 tablet by mouth daily as needed.     Not Delegated - Analgesics:  Opioid Agonist Combinations Failed - 03/16/2024 12:38 PM      Failed - This refill cannot be delegated      Failed - Urine Drug Screen completed in last 360 days      Failed - Valid encounter within last 3 months    Recent Outpatient Visits           3 months ago Mixed hyperlipidemia   [place] [facility] [place], Rankin Buzzard, NP       Future Appointments             In 2 months Baity, Rankin Buzzard, NP [place] [facility], [facility]

## 2024-03-23 ENCOUNTER — Ambulatory Visit: Admitting: Internal Medicine

## 2024-03-23 ENCOUNTER — Encounter: Payer: Self-pay | Admitting: Internal Medicine

## 2024-03-23 VITALS — BP 126/78 | Ht 72.0 in | Wt 236.8 lb

## 2024-03-23 DIAGNOSIS — E538 Deficiency of other specified B group vitamins: Secondary | ICD-10-CM | POA: Diagnosis not present

## 2024-03-23 DIAGNOSIS — R5383 Other fatigue: Secondary | ICD-10-CM | POA: Diagnosis not present

## 2024-03-23 DIAGNOSIS — E559 Vitamin D deficiency, unspecified: Secondary | ICD-10-CM | POA: Diagnosis not present

## 2024-03-23 DIAGNOSIS — R0683 Snoring: Secondary | ICD-10-CM

## 2024-03-23 DIAGNOSIS — G4719 Other hypersomnia: Secondary | ICD-10-CM

## 2024-03-23 DIAGNOSIS — G4700 Insomnia, unspecified: Secondary | ICD-10-CM
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
## 2024-03-23 NOTE — Patient Instructions (Signed)

## 2024-03-24 ENCOUNTER — Other Ambulatory Visit

## 2024-03-24 DIAGNOSIS — R5383 Other fatigue: Secondary | ICD-10-CM

## 2024-03-24 DIAGNOSIS — E559 Vitamin D deficiency, unspecified: Secondary | ICD-10-CM

## 2024-03-24 DIAGNOSIS — E538 Deficiency of other specified B group vitamins: Secondary | ICD-10-CM

## 2024-03-24 DIAGNOSIS — G4700 Insomnia, unspecified: Secondary | ICD-10-CM

## 2024-05-10 ENCOUNTER — Other Ambulatory Visit: Payer: Self-pay | Admitting: Internal Medicine

## 2024-05-10 DIAGNOSIS — Z79899 Other long term (current) drug therapy: Secondary | ICD-10-CM

## 2024-05-10 NOTE — Telephone Encounter (Unsigned)
 Copied from CRM [id number]. Topic: Clinical - Medication Refill >> May 10, 2024 11:19 AM Willma R wrote: Medication: HYDROcodone -acetaminophen  (NORCO) 10-325 MG tablet   Has the patient contacted their pharmacy? Yes, call dr  This is the patient's preferred pharmacy:  [facility] #[id number] [place], [place] - [address] AT [facility] OF [address] [place] [address] Phone: [phone] Fax: [phone]  Is this the correct pharmacy for this prescription? Yes If no, delete pharmacy and type the correct one.   Has the prescription been filled recently? No  Is the patient out of the medication? No, couple days left  Has the patient been seen for an appointment in the last year OR does the patient have an upcoming appointment? Yes  Can we respond through MyChart? No  Agent: Please be advised that Rx refills may take up to 3 business days. We ask that you follow-up with your pharmacy.

## 2024-05-11 MED ORDER — HYDROCODONE-ACETAMINOPHEN 10-325 MG PO TABS: 1.0000 | ORAL_TABLET | Freq: Every day | ORAL | 0 refills | Status: DC | PRN

## 2024-05-11 NOTE — Telephone Encounter (Signed)
 Requested medications are due for refill today.  yes  Requested medications are on the active medications list.  yes  Last refill. 03/16/2024 #30 0 rf  Future visit scheduled.   yes  Notes to clinic.  Refill not delegated.    Requested Prescriptions  Pending Prescriptions Disp Refills   HYDROcodone -acetaminophen  (NORCO) 10-325 MG tablet 30 tablet 0    Sig: Take 1 tablet by mouth daily as needed.     Not Delegated - Analgesics:  Opioid Agonist Combinations Failed - 05/11/2024  2:38 PM      Failed - This refill cannot be delegated      Failed - Urine Drug Screen completed in last 360 days      Failed - Valid encounter within last 3 months    Recent Outpatient Visits           1 month ago Excessive daytime sleepiness   [facility] [place], Angeline ORN, NP   5 months ago Mixed hyperlipidemia   [place] [facility] [place], Angeline ORN, NP       Future Appointments             In 1 week [place], Angeline ORN, NP [place] [facility], [facility]

## 2024-05-18 ENCOUNTER — Encounter: Payer: Self-pay | Admitting: Internal Medicine

## 2024-05-19 ENCOUNTER — Encounter: Payer: Self-pay | Admitting: Internal Medicine

## 2024-05-20 ENCOUNTER — Ambulatory Visit (INDEPENDENT_AMBULATORY_CARE_PROVIDER_SITE_OTHER): Payer: BC Managed Care – PPO | Admitting: Internal Medicine

## 2024-05-20 ENCOUNTER — Encounter: Payer: Self-pay | Admitting: Internal Medicine

## 2024-05-20 VITALS — BP 128/82 | Ht 72.0 in | Wt 240.8 lb

## 2024-05-20 DIAGNOSIS — Z0001 Encounter for general adult medical examination with abnormal findings: Secondary | ICD-10-CM | POA: Diagnosis not present

## 2024-05-20 DIAGNOSIS — E291 Testicular hypofunction: Secondary | ICD-10-CM | POA: Diagnosis not present

## 2024-05-20 DIAGNOSIS — R739 Hyperglycemia, unspecified: Secondary | ICD-10-CM

## 2024-05-20 DIAGNOSIS — E6609 Other obesity due to excess calories: Secondary | ICD-10-CM

## 2024-05-20 DIAGNOSIS — R6882 Decreased libido: Secondary | ICD-10-CM

## 2024-05-20 DIAGNOSIS — Z6832 Body mass index (BMI) 32.0-32.9, adult: Secondary | ICD-10-CM

## 2024-05-20 DIAGNOSIS — Z125 Encounter for screening for malignant neoplasm of prostate: Secondary | ICD-10-CM | POA: Diagnosis not present

## 2024-05-20 DIAGNOSIS — E66811 Obesity, class 1: Secondary | ICD-10-CM

## 2024-05-20 DIAGNOSIS — E782 Mixed hyperlipidemia: Secondary | ICD-10-CM

## 2024-05-20 DIAGNOSIS — R5383 Other fatigue: Secondary | ICD-10-CM

## 2024-05-20 NOTE — Patient Instructions (Signed)
 Health Maintenance, Male  Adopting a healthy lifestyle and getting preventive care are important in promoting health and wellness. Ask your health care provider about:  The right schedule for you to have regular tests and exams.  Things you can do on your own to prevent diseases and keep yourself healthy.  What should I know about diet, weight, and exercise?  Eat a healthy diet    Eat a diet that includes plenty of vegetables, fruits, low-fat dairy products, and lean protein.  Do not eat a lot of foods that are high in solid fats, added sugars, or sodium.  Maintain a healthy weight  Body mass index (BMI) is a measurement that can be used to identify possible weight problems. It estimates body fat based on height and weight. Your health care provider can help determine your BMI and help you achieve or maintain a healthy weight.  Get regular exercise  Get regular exercise. This is one of the most important things you can do for your health. Most adults should:  Exercise for at least 150 minutes each week. The exercise should increase your heart rate and make you sweat (moderate-intensity exercise).  Do strengthening exercises at least twice a week. This is in addition to the moderate-intensity exercise.  Spend less time sitting. Even light physical activity can be beneficial.  Watch cholesterol and blood lipids  Have your blood tested for lipids and cholesterol at 74 years of age, then have this test every 5 years.  You may need to have your cholesterol levels checked more often if:  Your lipid or cholesterol levels are high.  You are older than 74 years of age.  You are at high risk for heart disease.  What should I know about cancer screening?  Many types of cancers can be detected early and may often be prevented. Depending on your health history and family history, you may need to have cancer screening at various ages. This may include screening for:  Colorectal cancer.  Prostate cancer.  Skin cancer.  Lung  cancer.  What should I know about heart disease, diabetes, and high blood pressure?  Blood pressure and heart disease  High blood pressure causes heart disease and increases the risk of stroke. This is more likely to develop in people who have high blood pressure readings or are overweight.  Talk with your health care provider about your target blood pressure readings.  Have your blood pressure checked:  Every 3-5 years if you are 9-95 years of age.  Every year if you are 85 years old or older.  If you are between the ages of 29 and 29 and are a current or former smoker, ask your health care provider if you should have a one-time screening for abdominal aortic aneurysm (AAA).  Diabetes  Have regular diabetes screenings. This checks your fasting blood sugar level. Have the screening done:  Once every three years after age 23 if you are at a normal weight and have a low risk for diabetes.  More often and at a younger age if you are overweight or have a high risk for diabetes.  What should I know about preventing infection?  Hepatitis B  If you have a higher risk for hepatitis B, you should be screened for this virus. Talk with your health care provider to find out if you are at risk for hepatitis B infection.  Hepatitis C  Blood testing is recommended for:  Everyone born from [birth year] through 1966.  Anyone  with known risk factors for hepatitis C.  Sexually transmitted infections (STIs)  You should be screened each year for STIs, including gonorrhea and chlamydia, if:  You are sexually active and are younger than 74 years of age.  You are older than 74 years of age and your health care provider tells you that you are at risk for this type of infection.  Your sexual activity has changed since you were last screened, and you are at increased risk for chlamydia or gonorrhea. Ask your health care provider if you are at risk.  Ask your health care provider about whether you are at high risk for HIV. Your health care provider  may recommend a prescription medicine to help prevent HIV infection. If you choose to take medicine to prevent HIV, you should first get tested for HIV. You should then be tested every 3 months for as long as you are taking the medicine.  Follow these instructions at home:  Alcohol use  Do not drink alcohol if your health care provider tells you not to drink.  If you drink alcohol:  Limit how much you have to 0-2 drinks a day.  Know how much alcohol is in your drink. In the [place] one drink equals one 12 oz bottle of beer (355 mL), one 5 oz glass of wine (148 mL), or one 1 oz glass of hard liquor (44 mL).  Lifestyle  Do not use any products that contain nicotine or tobacco. These products include cigarettes, chewing tobacco, and vaping devices, such as e-cigarettes. If you need help quitting, ask your health care provider.  Do not use street drugs.  Do not share needles.  Ask your health care provider for help if you need support or information about quitting drugs.  General instructions  Schedule regular health, dental, and eye exams.  Stay current with your vaccines.  Tell your health care provider if:  You often feel depressed.  You have ever been abused or do not feel safe at home.  Summary  Adopting a healthy lifestyle and getting preventive care are important in promoting health and wellness.  Follow your health care provider's instructions about healthy diet, exercising, and getting tested or screened for diseases.  Follow your health care provider's instructions on monitoring your cholesterol and blood pressure.  This information is not intended to replace advice given to you by your health care provider. Make sure you discuss any questions you have with your health care provider.  Document Revised: 02/19/2021 Document Reviewed: 02/19/2021  Elsevier Patient Education  2024 [employer].

## 2024-05-20 NOTE — Progress Notes (Signed)
 Subjective:    Patient ID: Thomas Lamb, male    DOB: Nov 16, 1950, 73 y.o.   MRN: [id number]  HPI  Patient presents to clinic today for his annual exam.  Flu: never Tetanus: > 10 years ago COVID: Pfizer x 3 Pneumovax: never Prevnar: never Shingrix: never PSA screening: 10/2022 Colon screening: > 10 years ago Vision screening: annually Dentist: biannually  Diet: He does eat meat.  He consumes fruits and vegetables.  He does eat some fried foods.  He drinks mostly flavored water and Coke 0. Exercise: [employer] water aerobics  Review of Systems     Past Medical History:  Diagnosis Date   Chicken pox     Current Outpatient Medications  Medication Sig Dispense Refill   HYDROcodone -acetaminophen  (NORCO) 10-325 MG tablet Take 1 tablet by mouth daily as needed. 30 tablet 0   MAGNESIUM PO Take by mouth daily.     Multiple Vitamin (MULTIVITAMIN) tablet Take 1 tablet by mouth daily.     No current facility-administered medications for this visit.    Allergies  Allergen Reactions   Penicillins Hives    Family History  Problem Relation Age of Onset   Arthritis Mother    Arthritis Father    Stroke Father    Hypertension Father    Cancer Neg Hx    Diabetes Neg Hx    Early death Neg Hx     Social History   Socioeconomic History   Marital status: Married    Spouse name: Not on file   Number of children: Not on file   Years of education: Not on file   Highest education level: Not on file  Occupational History   Not on file  Tobacco Use   Smoking status: Former    Current packs/day: 0.00    Types: Cigarettes    Quit date: 10/14/2002    Years since quitting: 21.6   Smokeless tobacco: Never  Vaping Use   Vaping status: Never Used  Substance and Sexual Activity   Alcohol use: Yes    Alcohol/week: 2.0 standard drinks of alcohol    Types: 2 Cans of beer per week    Comment: occasional   Drug use: No   Sexual activity: Not on file  Other Topics Concern   Not on  file  Social History Narrative   Not on file   Social Drivers of Health   Financial Resource Strain: Low Risk  (06/06/2023)   Overall Financial Resource Strain (CARDIA)    Difficulty of Paying Living Expenses: Not very hard  Food Insecurity: No Food Insecurity (06/06/2023)   Hunger Vital Sign    Worried About Running Out of Food in the Last Year: Never true    Ran Out of Food in the Last Year: Never true  Transportation Needs: No Transportation Needs (06/06/2023)   PRAPARE - Administrator, Civil Service (Medical): No    Lack of Transportation (Non-Medical): No  Physical Activity: Sufficiently Active (06/06/2023)   Exercise Vital Sign    Days of Exercise per Week: 3 days    Minutes of Exercise per Session: 60 min  Stress: No Stress Concern Present (06/06/2023)   [employer] of Occupational Health - Occupational Stress Questionnaire    Feeling of Stress : Only a little  Social Connections: Moderately Isolated (06/06/2023)   Social Connection and Isolation Panel    Frequency of Communication with Friends and Family: More than three times a week    Frequency of Social  Gatherings with Friends and Family: Not on file    Attends Religious Services: Never    Active Member of Clubs or Organizations: No    Attends Banker Meetings: Never    Marital Status: Married  Catering manager Violence: Not At Risk (06/06/2023)   Humiliation, Afraid, Rape, and Kick questionnaire    Fear of Current or Ex-Partner: No    Emotionally Abused: No    Physically Abused: No    Sexually Abused: No     Constitutional: Pt reports fatigue. Denies fever, malaise, headache or abrupt weight changes.  HEENT: Denies eye pain, eye redness, ear pain, ringing in the ears, wax buildup, runny nose, nasal congestion, bloody nose, or sore throat. Respiratory: Denies difficulty breathing, shortness of breath, cough or sputum production.   Cardiovascular: Denies chest pain, chest tightness,  palpitations or swelling in the hands or feet.  Gastrointestinal: Denies abdominal pain, bloating, constipation, diarrhea or blood in the stool.  GU: Pt reports decreased libido. Denies urgency, frequency, pain with urination, burning sensation, blood in urine, odor or discharge. Musculoskeletal: Pt reports chronic back and right shoulder pain. Denies decrease in range of motion, difficulty with gait, or joint pain and swelling.  Skin: Denies redness, rashes, lesions or ulcercations.  Neurological: Denies dizziness, difficulty with memory, difficulty with speech or problems with balance and coordination.  Psych: Denies anxiety, depression, SI/HI.  No other specific complaints in a complete review of systems (except as listed in HPI above).  Objective:   Physical Exam   BP 128/82 (BP Location: Left Arm, Patient Position: Sitting, Cuff Size: Large)   Ht 6' (1.829 m)   Wt 240 lb 12.8 oz (109.2 kg)   BMI 32.66 kg/m    Wt Readings from Last 3 Encounters:  03/23/24 236 lb 12.8 oz (107.4 kg)  11/20/23 240 lb 12.8 oz (109.2 kg)  10/29/22 242 lb (109.8 kg)    General: Appears his stated age, obese, in NAD. Skin: Warm, dry and intact.  HEENT: Head: normal shape and size; Eyes: sclera white, no icterus, conjunctiva pink, PERRLA and EOMs intact;  Neck:  Neck supple, trachea midline. No masses, lumps or thyromegaly present.  Cardiovascular: Normal rate and rhythm. S1,S2 noted.  No murmur, rubs or gallops noted. No JVD or BLE edema. No carotid bruits noted. Pulmonary/Chest: Normal effort and positive vesicular breath sounds. No respiratory distress. No wheezes, rales or ronchi noted.  Abdomen: Normal bowel sounds.  Musculoskeletal: Deformity noted at the right lower medial lower leg.  Deformity noted of the right upper bicep.  Strength 5/5 BUE/BLE.  No difficulty with gait.  Neurological: Alert and oriented. Cranial nerves II-XII grossly intact. Coordination normal.  Psychiatric: Mood and  affect normal. Behavior is normal. Judgment and thought content normal.     BMET    Component Value Date/Time   NA 140 11/20/2023 1100   K 4.3 11/20/2023 1100   CL 102 11/20/2023 1100   CO2 31 11/20/2023 1100   GLUCOSE 98 11/20/2023 1100   BUN 22 11/20/2023 1100   CREATININE 1.29 (H) 11/20/2023 1100   CALCIUM 9.8 11/20/2023 1100    Lipid Panel     Component Value Date/Time   CHOL 179 11/20/2023 1100   TRIG 190 (H) 11/20/2023 1100   HDL 41 11/20/2023 1100   CHOLHDL 4.4 11/20/2023 1100   VLDL 39.8 06/15/2020 0954   LDLCALC 107 (H) 11/20/2023 1100    CBC    Component Value Date/Time   WBC 6.4 11/20/2023 1100  RBC 5.04 11/20/2023 1100   HGB 15.0 11/20/2023 1100   HCT 45.5 11/20/2023 1100   PLT 418 (H) 11/20/2023 1100   MCV 90.3 11/20/2023 1100   MCH 29.8 11/20/2023 1100   MCHC 33.0 11/20/2023 1100   RDW 13.6 11/20/2023 1100    Hgb A1C Lab Results  Component Value Date   HGBA1C 5.4 11/20/2023           Assessment & Plan:   Preventative Health Maintenance:  He declines flu shot He declines tetanus booster Encouraged him to get his COVID-vaccine He declines Pneumovax and Prevnar Discussed Shingrix, he will check coverage with his insurance company and schedule a visit at the pharmacy if he would like to have this done He declines colon cancer screening Encouraged him to consume a balanced diet and exercise regimen Advised him to see an eye doctor and dentist annually We will check CBC, c-Met, lipid, A1c, TSH and PSA today  Fatigue, decreased libido:  Will check TSH and testosterone  today  RTC in 6 months, follow-up chronic conditions Angeline Laura, NP

## 2024-05-20 NOTE — Assessment & Plan Note (Signed)
 Encourage diet and exercise for weight loss

## 2024-05-21 ENCOUNTER — Ambulatory Visit: Payer: Self-pay | Admitting: Internal Medicine

## 2024-05-21 DIAGNOSIS — R7989 Other specified abnormal findings of blood chemistry: Secondary | ICD-10-CM

## 2024-05-21 DIAGNOSIS — E079 Disorder of thyroid, unspecified: Secondary | ICD-10-CM

## 2024-05-21 LAB — COMPREHENSIVE METABOLIC PANEL WITH GFR
AG Ratio: 2.2 (calc) (ref 1.0–2.5)
ALT: 19 U/L (ref 9–46)
AST: 17 U/L (ref 10–35)
Albumin: 4.6 g/dL (ref 3.6–5.1)
Alkaline phosphatase (APISO): 55 U/L (ref 35–144)
BUN/Creatinine Ratio: 21 (calc) (ref 6–22)
BUN: 27 mg/dL — ABNORMAL HIGH (ref 7–25)
CO2: 30 mmol/L (ref 20–32)
Calcium: 9.5 mg/dL (ref 8.6–10.3)
Chloride: 103 mmol/L (ref 98–110)
Creat: 1.26 mg/dL (ref 0.70–1.28)
Globulin: 2.1 g/dL (ref 1.9–3.7)
Glucose, Bld: 83 mg/dL (ref 65–99)
Potassium: 5 mmol/L (ref 3.5–5.3)
Sodium: 140 mmol/L (ref 135–146)
Total Bilirubin: 0.5 mg/dL (ref 0.2–1.2)
Total Protein: 6.7 g/dL (ref 6.1–8.1)
eGFR: 60 mL/min/1.73m2 (ref >=60)

## 2024-05-21 LAB — CBC
HCT: 44.1 % (ref 38.5–50.0)
Hemoglobin: 14.3 g/dL (ref 13.2–17.1)
MCH: 30.1 pg (ref 27.0–33.0)
MCHC: 32.4 g/dL (ref 32.0–36.0)
MCV: 92.8 fL (ref 80.0–100.0)
MPV: 9.1 fL (ref 7.5–12.5)
Platelets: 381 Thousand/uL (ref 140–400)
RBC: 4.75 Million/uL (ref 4.20–5.80)
RDW: 13.5 % (ref 11.0–15.0)
WBC: 6.3 Thousand/uL (ref 3.8–10.8)

## 2024-05-21 LAB — LIPID PANEL
Cholesterol: 165 mg/dL (ref <200)
HDL: 37 mg/dL — ABNORMAL LOW (ref >=40)
LDL Cholesterol (Calc): 95 mg/dL
Non-HDL Cholesterol (Calc): 128 mg/dL (ref <130)
Total CHOL/HDL Ratio: 4.5 (calc) (ref <5.0)
Triglycerides: 238 mg/dL — ABNORMAL HIGH (ref <150)

## 2024-05-21 LAB — HEMOGLOBIN A1C
Hgb A1c MFr Bld: 5.3 % (ref <5.7)
Mean Plasma Glucose: 105 mg/dL
eAG (mmol/L): 5.8 mmol/L

## 2024-05-21 LAB — TSH: TSH: 9.3 m[IU]/L — ABNORMAL HIGH (ref 0.40–4.50)

## 2024-05-21 LAB — PSA: PSA: 0.49 ng/mL (ref <=4.00)

## 2024-05-21 LAB — TESTOSTERONE: Testosterone: 229 ng/dL — ABNORMAL LOW (ref 250–827)

## 2024-05-21 NOTE — Telephone Encounter (Signed)
 Referral to urology placed

## 2024-05-29 ENCOUNTER — Other Ambulatory Visit: Payer: Self-pay

## 2024-05-29 ENCOUNTER — Emergency Department
Admission: EM | Admit: 2024-05-29 | Discharge: 2024-05-29 | Disposition: A | Discharge status: Home / Self Care | Attending: Emergency Medicine | Admitting: Emergency Medicine

## 2024-05-29 ENCOUNTER — Encounter: Payer: Self-pay | Admitting: Intensive Care

## 2024-05-29 DIAGNOSIS — X58XXXA Exposure to other specified factors, initial encounter: Secondary | ICD-10-CM | POA: Diagnosis not present

## 2024-05-29 DIAGNOSIS — S0501XA Injury of conjunctiva and corneal abrasion without foreign body, right eye, initial encounter: Secondary | ICD-10-CM | POA: Insufficient documentation

## 2024-05-29 DIAGNOSIS — H5711 Ocular pain, right eye: Secondary | ICD-10-CM | POA: Diagnosis not present

## 2024-05-29 DIAGNOSIS — Y92007 Garden or yard of unspecified non-institutional (private) residence as the place of occurrence of the external cause: Secondary | ICD-10-CM | POA: Diagnosis not present

## 2024-05-29 MED ORDER — FLUORESCEIN SODIUM 1 MG OP STRP
2.0000 | ORAL_STRIP | Freq: Once | OPHTHALMIC | Status: AC
  Administered 2024-05-29: 2 via OPHTHALMIC
  Filled 2024-05-29: qty 2

## 2024-05-29 MED ORDER — ERYTHROMYCIN 5 MG/GM OP OINT: 1.0000 | TOPICAL_OINTMENT | Freq: Four times a day (QID) | OPHTHALMIC | 0 refills | Status: AC

## 2024-05-29 MED ORDER — TETRACAINE HCL 0.5 % OP SOLN
1.0000 [drp] | Freq: Once | OPHTHALMIC | Status: AC
  Administered 2024-05-29: 1 [drp] via OPHTHALMIC
  Filled 2024-05-29: qty 4

## 2024-05-29 NOTE — Discharge Instructions (Addendum)
 Please use antibiotics as directed.  Follow-up with your eye doctor within 48 hours for further evaluation.  Return here for new or worse symptoms.

## 2024-05-29 NOTE — ED Provider Notes (Signed)
 [place] EMERGENCY DEPARTMENT AT [facility] Provider Note   CSN: [id number] Arrival date & time: 05/29/24  1151     Patient presents with: Foreign Body in Eye   Thomas Lamb is a 73 y.o. male.   Patient here for foreign body in eye.  Earlier today he was outside doing yard work when some debris flew into his right eye.  Now has pain no difficulty with vision.  Does not wear contacts.  Denying injury elsewhere.   Foreign Body in Eye Pertinent negatives include no chest pain and no shortness of breath.       Prior to Admission medications   Medication Sig Start Date End Date Taking? Authorizing Provider  erythromycin  ophthalmic ointment Place 1 Application into the right eye 4 (four) times daily for 5 days. Half-inch ribbon 4 times daily for 5 days 05/29/24 06/03/24 Yes Daniah Zaldivar, PA-C  HYDROcodone -acetaminophen  (NORCO) 10-325 MG tablet Take 1 tablet by mouth daily as needed. 05/11/24   Antonette Angeline ORN, NP  MAGNESIUM PO Take by mouth daily.    [provider]  Multiple Vitamin (MULTIVITAMIN) tablet Take 1 tablet by mouth daily.    [provider]    Allergies: Penicillins    Review of Systems  Constitutional:  Negative for chills and fever.  HENT:  Negative for congestion.   Eyes:  Positive for pain and redness. Negative for visual disturbance.  Respiratory:  Negative for cough and shortness of breath.   Cardiovascular:  Negative for chest pain.  Genitourinary:  Negative for dysuria.  Neurological:  Negative for weakness and numbness.    Updated Vital Signs BP (!) 184/89 (BP Location: Left Arm)   Pulse 64   Temp 97.8 F (36.6 C) (Oral)   Resp 16   Ht 6' (1.829 m)   Wt 108.9 kg   SpO2 98%   BMI 32.55 kg/m   Physical Exam Vitals reviewed.  Constitutional:      Appearance: Normal appearance.  HENT:     Head: Normocephalic and atraumatic.     Nose: Nose normal.  Eyes:     General: Lids are normal. Lids are everted, no foreign  bodies appreciated. Vision grossly intact. Gaze aligned appropriately. No allergic shiner, visual field deficit or scleral icterus.       Right eye: No foreign body, discharge or hordeolum.        Left eye: No foreign body, discharge or hordeolum.     Extraocular Movements:     Right eye: Normal extraocular motion and no nystagmus.     Left eye: Normal extraocular motion and no nystagmus.     Conjunctiva/sclera:     Right eye: Right conjunctiva is injected. No chemosis or exudate.    Left eye: Left conjunctiva is not injected. No chemosis or exudate. Cardiovascular:     Pulses: Normal pulses.  Pulmonary:     Effort: Pulmonary effort is normal.  Musculoskeletal:     Cervical back: Normal range of motion.  Neurological:     Mental Status: He is alert and oriented to person, place, and time. Mental status is at baseline.  Psychiatric:        Mood and Affect: Mood normal.        Behavior: Behavior normal.     (all labs ordered are listed, but only abnormal results are displayed) Labs Reviewed - No data to display  EKG: None  Radiology: No results found.   Procedures   Medications Ordered in the ED  tetracaine  (PONTOCAINE) 0.5 % ophthalmic solution 1 drop (1 drop Both Eyes Given 05/29/24 1318)  fluorescein  ophthalmic strip 2 strip (2 strips Both Eyes Given 05/29/24 1318)                                    Medical Decision Making Patient here for right eye pain after suspected foreign body your debris earlier today.  No gross vision loss.  Mild decreased vision right eye.  I was able to visualize a right lateral infiltrate consistent with a corneal abrasion this is not overlying the frontal vision and it is actually over the sclera.  Will start on antibiotics.  Recommend follow-up with ophthalmology.  He does have a ophthalmologist he would prefer to see.  Will irrigate with Joesph lens prior to discharge.  Risk Prescription drug management.        Final diagnoses:   Abrasion of right cornea, initial encounter    ED Discharge Orders          Ordered    erythromycin  ophthalmic ointment  4 times daily        05/29/24 [address] [place] 05/29/24 1319    Jacolyn Pae, MD 05/29/24 1933

## 2024-05-29 NOTE — ED Notes (Signed)
 Eye drops & strips in RM

## 2024-05-29 NOTE — ED Triage Notes (Signed)
 Patient was doing yard and debris flew up in his eyes. Eyes are red and painful. Right eye worse

## 2024-05-29 NOTE — ED Notes (Signed)
 Visual Acuity R Distance: 20/50  L Distance: 20/20

## 2024-06-01 DIAGNOSIS — H53143 Visual discomfort, bilateral: Secondary | ICD-10-CM | POA: Diagnosis not present

## 2024-06-01 DIAGNOSIS — H16101 Unspecified superficial keratitis, right eye: Secondary | ICD-10-CM | POA: Diagnosis not present

## 2024-06-11 ENCOUNTER — Other Ambulatory Visit

## 2024-06-11 ENCOUNTER — Ambulatory Visit: Payer: BC Managed Care – PPO

## 2024-06-16 ENCOUNTER — Other Ambulatory Visit

## 2024-06-16 DIAGNOSIS — E079 Disorder of thyroid, unspecified: Secondary | ICD-10-CM

## 2024-06-16 DIAGNOSIS — R7989 Other specified abnormal findings of blood chemistry: Secondary | ICD-10-CM | POA: Diagnosis not present

## 2024-06-17 ENCOUNTER — Ambulatory Visit: Payer: Self-pay | Admitting: Internal Medicine

## 2024-06-17 DIAGNOSIS — E039 Hypothyroidism, unspecified: Secondary | ICD-10-CM

## 2024-06-17 LAB — TSH: TSH: 7.63 m[IU]/L — ABNORMAL HIGH (ref 0.40–4.50)

## 2024-06-17 LAB — T4, FREE: Free T4: 1 ng/dL (ref 0.8–1.8)

## 2024-06-17 LAB — THYROID PEROXIDASE ANTIBODIES (TPO) (REFL): Thyroperoxidase Ab SerPl-aCnc: 4 [IU]/mL (ref <9)

## 2024-06-17 MED ORDER — LEVOTHYROXINE SODIUM 25 MCG PO TABS: 25.0000 ug | ORAL_TABLET | Freq: Every day | ORAL | 0 refills | Status: AC

## 2024-06-17 NOTE — Telephone Encounter (Signed)
 Levothyroxine  and thyroid  ultrasound ordered. Someone will reach out to him to schedule the ultrasound. Needs lab appt in 1 month to recheck TSH and make sure medication is effective.

## 2024-06-28 ENCOUNTER — Telehealth: Payer: Self-pay

## 2024-06-28 ENCOUNTER — Ambulatory Visit

## 2024-06-28 NOTE — Telephone Encounter (Signed)
 Copied from CRM #[id number]. Topic: Clinical - Medical Advice >> Jun 28, 2024  8:47 AM Donna BRAVO wrote: Reason for CRM: patient calling, asking if the US  Thyroid  appt 0/15/25 at 3:15pm and will cost $800. Patient is asking if the US  is absolutely has to happen, and wanting to know if he can wait to see if the medicationlevothyroxine (SYNTHROID ) 25 MCG tablet medication will work  Patient does not use MyChart.

## 2024-06-28 NOTE — Telephone Encounter (Signed)
 I have no idea if it will cost $800.  I would say if it does then we should not do it.  It is not absolutely necessary just typically something that is done when somebody is diagnosed with a thyroid  disorder.  We can recheck the labs in a few weeks and see if the medication is effective.

## 2024-06-28 NOTE — Telephone Encounter (Signed)
 Patient advised per Angeline. Has upcoming lab appt on 07/20/2024.

## 2024-06-30 ENCOUNTER — Ambulatory Visit (INDEPENDENT_AMBULATORY_CARE_PROVIDER_SITE_OTHER): Admitting: Urology

## 2024-06-30 ENCOUNTER — Encounter: Payer: Self-pay | Admitting: Urology

## 2024-06-30 VITALS — BP 166/89 | HR 64 | Ht 72.0 in | Wt 230.0 lb

## 2024-06-30 DIAGNOSIS — R7989 Other specified abnormal findings of blood chemistry: Secondary | ICD-10-CM

## 2024-06-30 NOTE — Progress Notes (Signed)
 06/30/2024 9:09 AM   Thomas Lamb 07/19/1951 [id number]  Referring provider: Antonette Lamb ORN, NP [address] [place],  [place] [address]  Chief Complaint  Patient presents with   Hypogonadism    HPI: Thomas Lamb is a 73 y.o. male referred for evaluation of hypogonadism.  Several month history of tiredness/fatigue, decreased mental focus, low libido and increased weight gain.  Mild ED Testosterone  level last month low at 229 ng/dL No repeat testosterone  or LH checked. PSA low and stable at 0.49 Past urologic history: Prior hydrocelectomy   PMH: Past Medical History:  Diagnosis Date   Chicken pox     Surgical History: Past Surgical History:  Procedure Laterality Date   APPENDECTOMY  2007   BACK SURGERY  [id number]   KNEE SURGERY Left 1966   calcium removal   TONSILLECTOMY AND ADENOIDECTOMY  1957    Home Medications:  Allergies as of 06/30/2024       Reactions   Penicillins Hives        Medication List        Accurate as of June 30, 2024  9:09 AM. If you have any questions, ask your nurse or doctor.          HYDROcodone -acetaminophen  10-325 MG tablet Commonly known as: NORCO Take 1 tablet by mouth daily as needed.   levothyroxine  25 MCG tablet Commonly known as: SYNTHROID  Take 1 tablet (25 mcg total) by mouth daily.   MAGNESIUM PO Take by mouth daily.   multivitamin tablet Take 1 tablet by mouth daily.        Allergies:  Allergies  Allergen Reactions   Penicillins Hives    Family History: Family History  Problem Relation Age of Onset   Arthritis Mother    Arthritis Father    Stroke Father    Hypertension Father    Cancer Neg Hx    Diabetes Neg Hx    Early death Neg Hx     Social History:  reports that he quit smoking about 21 years ago. His smoking use included cigarettes. He has never used smokeless tobacco. He reports current alcohol use of about 2.0 standard drinks of alcohol per week. He reports current drug  use.   Physical Exam: BP (!) 166/89   Pulse 64   Ht 6' (1.829 m)   Wt 230 lb (104.3 kg)   BMI 31.19 kg/m   Constitutional:  Alert, No acute distress. HEENT: Thomas Lamb AT Respiratory: Normal respiratory effort, no increased work of breathing. GU: Phallus without lesions.  Testes descended bilaterally without masses or tenderness.  Estimated volume 20 cc bilaterally Psychiatric: Normal mood and affect.   Assessment & Plan:    1.  Hypogonadism We discussed the diagnosis of testosterone  is based on 2 abnormal total testosterone  levels drawn in the a.m. admitted with signs and symptoms of low testosterone . Repeat testosterone  level ordered this morning along with an LH We discussed various forms of testosterone  placement including topical preparations, intramuscular injections, subcutaneous injections, subcutaneous pellet implantation and oral testosterone .  Pros and cons of each form were discussed.  The risk of transference of topical testosterone . We discussed there is no conclusive evidence linking testosterone  therapy to the development of prostate cancer; there is no definitive evidence linking testosterone  therapy to a higher incidence of venothrombolic events; at the present time it cannot be stated definitively whether testosterone  therapy increases or decreases the risk of cardiovascular events including myocardial infarction and stroke. Potential side effects were discussed  including erythrocytosis, gynecomastia.  The need for regular monitoring of testosterone  levels and hematocrit was discussed. He will be notified with his repeat lab results He would prefer oral testosterone  if covered by insurance and if not covered topical testosterone     Thomas JAYSON Barba, MD  [facility] [address] [place], [place] [address] [phone]

## 2024-07-01 LAB — TESTOSTERONE: Testosterone: 413 ng/dL (ref 264–916)

## 2024-07-01 LAB — HEMOGLOBIN AND HEMATOCRIT, BLOOD
Hematocrit: 45.9 % (ref 37.5–51.0)
Hemoglobin: 15.2 g/dL (ref 13.0–17.7)

## 2024-07-02 ENCOUNTER — Ambulatory Visit: Payer: Self-pay | Admitting: Urology

## 2024-07-06 ENCOUNTER — Other Ambulatory Visit: Payer: Self-pay | Admitting: Internal Medicine

## 2024-07-06 DIAGNOSIS — Z79899 Other long term (current) drug therapy: Secondary | ICD-10-CM

## 2024-07-06 MED ORDER — HYDROCODONE-ACETAMINOPHEN 10-325 MG PO TABS: 1.0000 | ORAL_TABLET | Freq: Every day | ORAL | 0 refills | Status: AC | PRN

## 2024-07-06 NOTE — Telephone Encounter (Signed)
 Copied from CRM #[id number]. Topic: Clinical - Medication Refill >> Jul 06, 2024  8:17 AM Carlatta H wrote: Medication: HYDROcodone -acetaminophen  (NORCO) 10-325 MG tablet  Has the patient contacted their pharmacy? No (Agent: If no, request that the patient contact the pharmacy for the refill. If patient does not wish to contact the pharmacy document the reason why and proceed with request.) (Agent: If yes, when and what did the pharmacy advise?)  This is the patient's preferred pharmacy:  [id number] [place], [place] - [address] AT [facility] OF [address] [place] [address] Phone: [phone] Fax: [phone]  Is this the correct pharmacy for this prescription? Yes If no, delete pharmacy and type the correct one.   Has the prescription been filled recently? No  Is the patient out of the medication? Yes  Has the patient been seen for an appointment in the last year OR does the patient have an upcoming appointment? Yes  Can we respond through MyChart? No  Agent: Please be advised that Rx refills may take up to 3 business days. We ask that you follow-up with your pharmacy.

## 2024-07-20 ENCOUNTER — Other Ambulatory Visit

## 2024-08-13 ENCOUNTER — Ambulatory Visit

## 2024-11-17 ENCOUNTER — Ambulatory Visit: Admitting: Internal Medicine
